# Patient Record
Sex: Male | Born: 1945 | Race: White | Hispanic: No | Marital: Married | Smoking: Current every day smoker
Health system: Southern US, Community
[De-identification: ages and names within clinical notes are randomized; demographics above are authoritative.]

## PROBLEM LIST (undated history)

## (undated) DIAGNOSIS — I700 Atherosclerosis of aorta: Secondary | ICD-10-CM

## (undated) DIAGNOSIS — Z8614 Personal history of Methicillin resistant Staphylococcus aureus infection: Secondary | ICD-10-CM

## (undated) DIAGNOSIS — D509 Iron deficiency anemia, unspecified: Secondary | ICD-10-CM

## (undated) DIAGNOSIS — D649 Anemia, unspecified: Secondary | ICD-10-CM

## (undated) DIAGNOSIS — F99 Mental disorder, not otherwise specified: Secondary | ICD-10-CM

## (undated) DIAGNOSIS — Z5189 Encounter for other specified aftercare: Secondary | ICD-10-CM

## (undated) DIAGNOSIS — C801 Malignant (primary) neoplasm, unspecified: Secondary | ICD-10-CM

## (undated) DIAGNOSIS — M1990 Unspecified osteoarthritis, unspecified site: Secondary | ICD-10-CM

## (undated) DIAGNOSIS — J449 Chronic obstructive pulmonary disease, unspecified: Secondary | ICD-10-CM

## (undated) DIAGNOSIS — T7840XA Allergy, unspecified, initial encounter: Secondary | ICD-10-CM

## (undated) DIAGNOSIS — D3615 Benign neoplasm of peripheral nerves and autonomic nervous system of abdomen: Secondary | ICD-10-CM

## (undated) DIAGNOSIS — I10 Essential (primary) hypertension: Secondary | ICD-10-CM

## (undated) DIAGNOSIS — F32A Depression, unspecified: Secondary | ICD-10-CM

## (undated) DIAGNOSIS — F419 Anxiety disorder, unspecified: Secondary | ICD-10-CM

## (undated) HISTORY — PX: FRACTURE SURGERY: SHX138

## (undated) HISTORY — PX: APPENDECTOMY: SHX54

## (undated) HISTORY — DX: Personal history of Methicillin resistant Staphylococcus aureus infection: Z86.14

## (undated) HISTORY — PX: ANKLE SURGERY: SHX546

## (undated) HISTORY — DX: Atherosclerosis of aorta: I70.0

## (undated) HISTORY — PX: TONSILLECTOMY: SUR1361

## (undated) HISTORY — DX: Malignant (primary) neoplasm, unspecified: C80.1

## (undated) HISTORY — DX: Anxiety disorder, unspecified: F41.9

## (undated) HISTORY — DX: Chronic obstructive pulmonary disease, unspecified: J44.9

## (undated) HISTORY — DX: Anemia, unspecified: D64.9

## (undated) HISTORY — DX: Allergy, unspecified, initial encounter: T78.40XA

## (undated) HISTORY — DX: Unspecified osteoarthritis, unspecified site: M19.90

## (undated) HISTORY — DX: Depression, unspecified: F32.A

## (undated) HISTORY — DX: Essential (primary) hypertension: I10

## (undated) HISTORY — DX: Iron deficiency anemia, unspecified: D50.9

## (undated) HISTORY — DX: Mental disorder, not otherwise specified: F99

## (undated) HISTORY — DX: Encounter for other specified aftercare: Z51.89

## (undated) HISTORY — DX: Benign neoplasm of peripheral nerves and autonomic nervous system of abdomen: D36.15

---

## 2003-06-23 ENCOUNTER — Ambulatory Visit (HOSPITAL_COMMUNITY): Admission: RE | Admit: 2003-06-23 | Discharge: 2003-06-23 | Payer: Self-pay | Admitting: Pulmonary Disease

## 2007-01-18 ENCOUNTER — Observation Stay (HOSPITAL_COMMUNITY): Admission: AD | Admit: 2007-01-18 | Discharge: 2007-01-19 | Payer: Self-pay | Admitting: Pulmonary Disease

## 2007-01-18 ENCOUNTER — Ambulatory Visit: Payer: Self-pay | Admitting: Gastroenterology

## 2007-02-02 ENCOUNTER — Ambulatory Visit (HOSPITAL_COMMUNITY): Admission: RE | Admit: 2007-02-02 | Discharge: 2007-02-02 | Payer: Self-pay | Admitting: Gastroenterology

## 2007-02-02 ENCOUNTER — Ambulatory Visit: Payer: Self-pay | Admitting: Gastroenterology

## 2007-02-02 ENCOUNTER — Encounter: Payer: Self-pay | Admitting: Gastroenterology

## 2007-02-02 HISTORY — PX: COLONOSCOPY: SHX174

## 2007-02-02 HISTORY — PX: ESOPHAGOGASTRODUODENOSCOPY: SHX1529

## 2007-02-19 ENCOUNTER — Ambulatory Visit: Payer: Self-pay | Admitting: Gastroenterology

## 2007-02-19 ENCOUNTER — Ambulatory Visit (HOSPITAL_COMMUNITY): Admission: RE | Admit: 2007-02-19 | Discharge: 2007-02-19 | Payer: Self-pay | Admitting: Gastroenterology

## 2007-02-19 HISTORY — PX: OTHER SURGICAL HISTORY: SHX169

## 2007-03-01 ENCOUNTER — Ambulatory Visit: Payer: Self-pay | Admitting: Gastroenterology

## 2007-04-12 ENCOUNTER — Ambulatory Visit: Payer: Self-pay | Admitting: Gastroenterology

## 2008-10-24 ENCOUNTER — Ambulatory Visit (HOSPITAL_COMMUNITY): Admission: RE | Admit: 2008-10-24 | Discharge: 2008-10-24 | Payer: Self-pay | Admitting: Pulmonary Disease

## 2011-01-25 NOTE — H&P (Signed)
NAME:  Joshua Fuller, Joshua Fuller             ACCOUNT NO.:  [id number]   MEDICAL RECORD NO.:  [id number]          PATIENT TYPE:  OBV   LOCATION:  A339                          FACILITY:  [facility]   PHYSICIAN:  Edward L. Juanetta Gosling, M.D.DATE OF BIRTH:  Jan 06, 1946   DATE OF ADMISSION:  01/18/2007  DATE OF DISCHARGE:  05/09/2008LH                              HISTORY & PHYSICAL   This is a combined history and physical and discharge summary on this  patient in observation.   Joshua Fuller was brought in for observation because of anemia.  He had  come by my office one day prior for lab work in anticipation of having a  complete physical examination.  He was found to have a hemoglobin level  of 5.  He has noticed that he has been very weak.  He has noticed that  his heart has beat faster than usual and he has not felt as well.  Otherwise, he has been feeling fairly well.  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  I have not actually seen  him for about four years in my office.  He has otherwise been healthy.  He has noticed that his stools are somewhat dark, but he has not noticed  any black stools.  He has had no vomiting.  No bleeding.  No other  problems.   PAST MEDICAL HISTORY:  1. Mental illness.  2. Apparently a MRSA infection of his skin.  3. Otherwise in excellent health.   SOCIAL HISTORY:  He is married but apparently separated.  He drinks  about one bottle of wine per week.  He is a former smoker, stopped about  six years ago.  He had a previous history of some marijuana use.   FAMILY HISTORY:  Negative for any sort of anemia or any other problems.   MEDICATIONS:  Aspirin and Lamictal.   REVIEW OF SYSTEMS:  Otherwise pretty much negative.   PHYSICAL EXAMINATION:  VITAL SIGNS:  Temperature 97.3, pulse 59,  respirations 18, blood pressure 111/73, O2 sats 99% on room air.  HEENT:   Pupils are equal, round, reactive to light and accommodation.  Nose and throat are clear.  Neck supple without masses.  His conjunctive  are not as pale now, he has already had 3 units of blood.  CARDIAC:  His heart is regular without murmur, gallop or rub.  ABDOMEN:  Soft without masses.  EXTREMITIES:  No edema.  CENTRAL NERVOUS SYSTEM:  Grossly intact.   LABORATORY DATA:  His hemoglobin was 5 in my office.   ASSESSMENT:  He is markedly anemic.  I suspect that it is iron  deficiency from gastrointestinal blood loss that has been occurring over  a long period of time.  He is going to receive 3 units of blood, then  have blood rechecked.  If he is over 10, I will problem let him go home.  He is going to have preliminary consultation with the GI service in  anticipation  of him having workup as an outpatient.      Edward L. Juanetta Gosling, M.D.  Electronically Signed     ELH/MEDQ  D:  01/19/2007  T:  01/19/2007  Job:  [id number]

## 2011-01-25 NOTE — Assessment & Plan Note (Signed)
NAME:  Joshua Fuller, Joshua Fuller              CHART#:  [id number]   DATE:  04/12/2007                       DOB:  December 08, 1945   PROBLEM LIST:  1. Profound iron deficiency anemia in May of 2008 presenting with a      ferritin of 1 and a hemoglobin of 5.  2. Obsessive compulsive disorder.  3. History of MRSA.   SUBJECTIVE:  Joshua Fuller is a 65 year old male who presented as an  outpatient with extreme fatigue. His hemoglobin was found to be 5, with  an MCV of 65.8, and a ferritin of 1. He received 3 units of packed red  blood cells and his discharge hemoglobin was 8.8. He had an extensive GI  work up to include an upper endoscopy and a colonoscopy which did not  reveal a source for his anemia. He has had a capsule endoscopy in June  of 2008, which showed a normal small bowel mucosa without evidence of  AVMs, masses, or ulcerations. He did have small bowel biopsies during  his upper endoscopy which showed no evidence of celiac sprue. He  presents today for follow up visit for his anemia. He denies any bright  red blood per rectum or black tarry stools. He is taking iron daily, and  eating chicken liver, and oysters. He states his last hemoglobin was  checked on Friday.   MEDICATIONS:  1. Lamictal 100 mg daily.  2. Feosol daily.   OBJECTIVE:  GENERAL: He is in no apparent distress, alert and oriented  x4.  NECK: Full range of motion. No lymphadenopathy.  LUNGS: Clear to auscultation bilaterally.  CARDIOVASCULAR EXAM: Regular rhythm. No murmur. Normal S1 and S2.  ABDOMEN: Bowel sounds are present. Soft, nontender, nondistended. No  rebound or guarding.   ASSESSMENT:  Joshua Fuller is a 65 year old male with profound iron  deficiency anemia. Recently had a hemoglobin checked at Dr. Neita Garnet  office a week ago. His gastrointestinal work up did not reveal a source  for his iron deficiency anemia. Thank you for allowing me to see [name] in consultation. My recommendations follow.   RECOMMENDATIONS:  1. We will check a hemoglobin, hematocrit, reticulocyte count, and      ferritin today. If his bone marrow response is inappropriate to his      degree of anemia or if his anemia persists then we will generate      referal to Dr. Mariel Sleet hematology.  2. Outpatient visit in 4 months.  3. Continue Feosol.       Kassie Mends, M.D.  Electronically Signed     SM/MEDQ  D:  04/12/2007  T:  04/12/2007  Job:  [id number]   cc:   Ramon Dredge L. Juanetta Gosling, M.D.

## 2011-01-25 NOTE — Consult Note (Signed)
NAME:  Joshua Fuller, Joshua Fuller             ACCOUNT NO.:  [id number]   MEDICAL RECORD NO.:  [id number]          PATIENT TYPE:  OBV   LOCATION:  A339                          FACILITY:  [facility]   PHYSICIAN:  Kassie Mends, M.D.      DATE OF BIRTH:  [birth year]   DATE OF CONSULTATION:  01/19/2007  DATE OF DISCHARGE:                                 CONSULTATION   REASON FOR CONSULTATION:  Microcytic anemia.   PHYSICIAN REQUESTING CONSULTATION:  Edward L. Juanetta Gosling, M.D.   HISTORY OF PRESENT ILLNESS:  [name] is a 65 year old Caucasian  gentleman who went to see Dr. Juanetta Gosling recently for complaints of  worsening fatigue.  Outpatient labs revealed a hemoglobin of 5,  hematocrit 19.8, MCV 65.8.  He was admitted for observation in order to  receive blood transfusions.  He has received 3 units of packed red blood  cells overnight.  Post-transfusion hemoglobin was 8.3.   We have been asked to see the patient in order to schedule outpatient  workup.  Dr. Juanetta Gosling is planning on discharging the patient today.  He  denies any melena or bright red blood per rectum.  He has had no acid  reflux disease, nausea or vomiting, heartburn, dysphagia or odynophagia.  He has never received a blood transfusion before.  No family history of  colorectal cancer.  He has had no prior colonoscopy or EGD.   MEDICATIONS AT HOME:  1. Lamictal 100 mg daily.  2. Aspirin 325 mg daily.   ALLERGIES:  PENICILLIN causes rash.   PAST MEDICAL HISTORY:  1. Obsessive compulsive disorder.  2. History of MRSA.   PAST SURGICAL HISTORY:  He has had 4 surgeries for infected cysts or  boils as well as pilonidal cyst.  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  The last time was 6 months ago.  He has had a prior  ankle surgery for fracture, tonsillectomy and adenoidectomy.   FAMILY HISTORY:  Negative for colorectal cancer or chronic GI illnesses.   SOCIAL HISTORY:  Currently separated.  He has 4 children.  He is retired  from [employer], where he served for over 20 years.  Currently works at  [employer].  Quit smoking tobacco in 2002.  Previous history  of marijuana use and consumes approximately 3-4 glasses of wine a week.   REVIEW OF SYSTEMS:  GASTROINTESTINAL:  See HPI.  CONSTITUTIONAL:  No  weight loss.  CARDIOPULMONARY:  No chest pain.  Complains of dyspnea on  exertion.   PHYSICAL EXAMINATION:  VITAL SIGNS:  Temperature 97.3, pulse 59,  respirations 18, blood pressure 111/73.  Weight 70.7 kg.  Height 68  inches.  GENERAL:  A pleasant, well-nourished, well-developed Caucasian male in  no acute distress.  SKIN:  Warm and dry, no jaundice.  HEENT:  Sclerae are nonicteric.  Oropharyngeal mucosa moist and pink.  No lesions, erythema or exudate.  No lymphadenopathy or thyromegaly.  CHEST:  Lungs clear to auscultation.  CARDIAC:  Regular rate and rhythm, normal S1, S2.  No murmurs, rubs or  gallops.  ABDOMEN:  Positive bowel sounds.  Abdomen is soft, nontender,  nondistended.  No organomegaly or masses.  No rebound tenderness or  guarding.  No abdominal bruits or hernias.  EXTREMITIES:  No edema.   IMPRESSION:  A 65 year old Caucasian gentleman with profound microcytic  anemia, most likely due to iron-deficiency anemia.  Differential  diagnosis includes chronic occult gastrointestinal bleed versus poor  iron absorption (less likely).   RECOMMENDATIONS:  Colonoscopy with possible EGD.  Will plan this on an  outpatient basis with Dr. Cira Servant.      Tana Coast, P.A.      Kassie Mends, M.D.  Electronically Signed    LL/MEDQ  D:  01/19/2007  T:  01/19/2007  Job:  [id number]   cc:   Ramon Dredge L. Juanetta Gosling, M.D.  Fax: [phone]

## 2011-01-25 NOTE — Op Note (Signed)
NAME:  Joshua Fuller, Joshua Fuller             ACCOUNT NO.:  [id number]   MEDICAL RECORD NO.:  [id number]          PATIENT TYPE:  AMB   LOCATION:  DAY                           FACILITY:  [facility]   PHYSICIAN:  Kassie Mends, M.D.      DATE OF BIRTH:  30-May-1946   DATE OF PROCEDURE:  02/19/2007  DATE OF DISCHARGE:  02/19/2007                               OPERATIVE REPORT   REFERRING PHYSICIAN:  Dr. Kari Baars   PROCEDURE:  Capsule endoscopy.   INDICATION FOR EXAMINATION:  [name] is a 65 year old male who  presented to Dr. Juanetta Gosling with a hemoglobin of 5 and MCV of 65.6.  He had  no evidence of obvious GI bleeding.  He had a colonoscopy and an upper  endoscopy in May 2008 which revealed only revealed 2 rectal polyps and a  large hiatal hernia.  He had biopsies performed of his duodenal mucosa  which were negative for celiac sprue.  His capsule endoscopy is  performed to further evaluate his anemia.   PROCEDURE DATA:  Height 68 inches, weight 150 pounds.  Gastric passage  time:  1 hour and 3 minutes.  Small bowel passage time:  4 hours and 35  minutes.   PROCEDURE INFORMATION AND FINDINGS:  Limited view of gastric mucosa.  Normal small bowel mucosa.  No AVMs, masses, or ulcers.  Excellent view  of the small bowel mucosa.  Limited view of the colon mucosa due to  retained stool.   SUMMARY AND RECOMMENDATIONS:  No source for his profound iron deficiency  anemia was identified.  Would consider hematology referral.  Continue on  iron supplementation.      Kassie Mends, M.D.  Electronically Signed     SM/MEDQ  D:  03/01/2007  T:  03/01/2007  Job:  [id number]   cc:   Ramon Dredge L. Juanetta Gosling, M.D.  Fax: [phone]

## 2011-01-25 NOTE — Op Note (Signed)
NAME:  Joshua Fuller, Joshua Fuller             ACCOUNT NO.:  [id number]   MEDICAL RECORD NO.:  [id number]          PATIENT TYPE:  AMB   LOCATION:  DAY                           FACILITY:  [facility]   PHYSICIAN:  Kassie Mends, M.D.      DATE OF BIRTH:  August 29, 1946   DATE OF PROCEDURE:  02/02/2007  DATE OF DISCHARGE:                               OPERATIVE REPORT   PROCEDURES:  1. Colonoscopy with cold forceps polypectomy.  2. Esophagogastroduodenoscopy with cold forceps biopsy.   INDICATION FOR EXAMINATION:  Joshua Fuller is a 65 year old male who  presented to Dr. Juanetta Fuller with a hemoglobin of 5 and an MCV of  approximately 65.6.  He had no evidence of obvious GI bleeding. His  endoscopic evaluation is being performed to evaluate the etiology of his  microcytic anemia.   FINDINGS:  1. Two 4-mm to 6-mm rectal polyps removed via cold forceps.      Otherwise, sigmoid colon diverticulosis and internal hemorrhoids.      No masses, inflammatory changes, or AVMs seen.  2. Large hiatal hernia closely examined and no evidence of Cameron      ulcers.  Otherwise, normal esophagus, without evidence of      Barrett's, erosions, ulceration, or mass.  No antral erosions or      ulcerations.  Normal duodenal bulb and second portion of the      duodenum.  Biopsies obtained to evaluate for celiac sprue.   RECOMMENDATIONS:  1. Screening colonoscopy in 5 years if polyps are adenomatous.  If      polyps are adenomatous, then his siblings and children should have      a screening colonoscopy at age 32 and then every 5 years because      his polyp was diagnosed at the age of 84.  2. He should begin a high-fiber diet.  He was given a handout on a      high-fiber diet, polyps, hemorrhoids, and diverticulosis.  3. No aspirin, NSAIDs, or anticoagulation for 5 days.  4. He is given a handout on a hiatal hernia.  5. Return patient visit in one month to discuss his microcytic anemia.      He may need a capsule endoscopy.  It  is possible the capsule      endoscopy may need to be placed via EGD due to the size of his      hiatal hernia.   MEDICATIONS:  1. Demerol 100 mg IV.  2. Versed 5 mg IV.   PROCEDURE TECHNIQUE:  Physical exam was performed and informed consent  was obtained from the patient after explaining the benefits, risks, and  alternatives to the procedure.  The patient was connected to the monitor  and placed in the left lateral position.  Continuous oxygen was provided  by nasal cannula and IV management was instituted through an indwelling  cannula.  After administration of sedation, and rectal exam, the  patient's rectum was intubated and the scope was advanced under direct  visualization to the cecum.  The scope was subsequently removed slowly  by carefully examining the color, texture,  anatomy and integrity of the  mucosa on the way out.   After the colonoscopy, the patient's esophagus was intubated with the  diagnostic gastroscope and the scope was advanced under direct  visualization to the second portion of the duodenum.  The scope was  subsequently removed slowly, with careful examination of the color,  texture, anatomy, and integrity of the mucosa on the way out.  The  patient was recovered in the endoscopy recovery room and discharged home  in satisfactory condition.      Kassie Mends, M.D.  Electronically Signed     SM/MEDQ  D:  02/02/2007  T:  02/02/2007  Job:  [id number]   cc:   Joshua Fuller, M.D.  Fax: [phone]

## 2011-04-01 ENCOUNTER — Ambulatory Visit (HOSPITAL_COMMUNITY)
Admission: RE | Admit: 2011-04-01 | Discharge: 2011-04-01 | Disposition: A | Discharge status: Home / Self Care | Payer: BC Managed Care – PPO | Source: Ambulatory Visit | Attending: Pulmonary Disease | Admitting: Pulmonary Disease

## 2011-04-01 ENCOUNTER — Other Ambulatory Visit (HOSPITAL_COMMUNITY): Payer: Self-pay | Admitting: Pulmonary Disease

## 2011-04-01 DIAGNOSIS — F17200 Nicotine dependence, unspecified, uncomplicated: Secondary | ICD-10-CM

## 2011-12-20 ENCOUNTER — Encounter: Payer: Self-pay | Admitting: Gastroenterology

## 2012-12-20 ENCOUNTER — Telehealth: Payer: Self-pay | Admitting: Gastroenterology

## 2012-12-20 NOTE — Telephone Encounter (Signed)
Pt called to set up overdue colonoscopy. He can be reached at [phone]

## 2012-12-20 NOTE — Telephone Encounter (Signed)
Called pt. He is scheduled an OV with Tana Coast, PA on 01/18/2013 at 10:00 AM. Previous hx of anemia.

## 2013-01-16 ENCOUNTER — Encounter: Payer: Self-pay | Admitting: Gastroenterology

## 2013-01-18 ENCOUNTER — Ambulatory Visit (INDEPENDENT_AMBULATORY_CARE_PROVIDER_SITE_OTHER): Payer: Medicare Other | Admitting: Gastroenterology

## 2013-01-18 ENCOUNTER — Encounter: Payer: Self-pay | Admitting: Gastroenterology

## 2013-01-18 VITALS — BP 122/72 | HR 56 | Temp 97.7°F | Ht 68.0 in | Wt 144.2 lb

## 2013-01-18 DIAGNOSIS — D500 Iron deficiency anemia secondary to blood loss (chronic): Secondary | ICD-10-CM | POA: Insufficient documentation

## 2013-01-18 DIAGNOSIS — D509 Iron deficiency anemia, unspecified: Secondary | ICD-10-CM

## 2013-01-18 NOTE — Patient Instructions (Addendum)
1. I will review a copy of your last blood work from Dr. Juanetta Gosling. 2. Please return stool specimen as soon as you can. 3. We will call you with further recommendations regarding when your next colonoscopy should be.

## 2013-01-18 NOTE — Progress Notes (Signed)
Cc PCP 

## 2013-01-18 NOTE — Assessment & Plan Note (Signed)
67 year old gentleman with history of unexplained iron deficiency anemia 6 years ago. Extensive evaluation as outlined above. At this point, I'll request copy of last hemoglobin and iron from Dr. Juanetta Gosling. iFOBT to be collected. Patient tells me he is nervous about prolonging colonoscopy given his history of unexplained iron deficiency anemia. He is interested in pursuing colonoscopy in the upcoming future. Further recommendations to follow once we've received a copy of his latest labs and check for blood in the stool.

## 2013-01-18 NOTE — Progress Notes (Signed)
Primary Care Physician: Fredirick Maudlin, MD  Primary Gastroenterologist:  Jonette Eva, MD   Chief Complaint  Patient presents with  . Colonoscopy    HPI: Joshua Fuller is a 67 y.o. male here for consideration of colonoscopy. We initially met this patient back in 2008 when he presented with profound iron deficiency anemia with hemoglobin in the 5 range. He underwent transfusion. He had an EGD and colonoscopy which failed to reveal a cause of his iron deficiency anemia. He had a single hyperplastic polyp removed from his colon. Small bowel biopsies were negative for celiac disease. He also had small bowel capsule endoscopy which was unremarkable. Patient states his anemia simply resolved with oral iron. He never saw the hematologist. We inadvertently sent him a letter last year stating he was due for his colonoscopy. This was to be done only if he had adenomatous polyps but he actually had hyperplastic polyps. He has no family history of colon polyps or colon cancer. Patient believes his most recent hemoglobin has been normal.  He has some vague solid food dysphagia to chicken only. On occasion he has had to bring the food back up because it became lodged. He does not want to have esophageal dilation. Denies heartburn, odynophagia, abdominal pain, vomiting, weight loss, constipation, diarrhea, melena, rectal bleeding.  Current Outpatient Prescriptions  Medication Sig Dispense Refill  . lamoTRIgine (LAMICTAL) 100 MG tablet Take 100 mg by mouth daily.      Marland Kitchen lisinopril (PRINIVIL,ZESTRIL) 10 MG tablet Take 10 mg by mouth daily.      . NON FORMULARY Iron     Daily ( not sure mg)      . Vilazodone HCl (VIIBRYD) 40 MG TABS Take 40 mg by mouth daily. Said he takes 30 mg daily by cutting the pill in half and taking half and then 1/2 of the other half       No current facility-administered medications for this visit.    Allergies as of 01/18/2013 - Review Complete 01/18/2013  Allergen Reaction  Noted  . Penicillins Rash 01/18/2013   Past Medical History  Diagnosis Date  . Chronic mental illness     OCD  . History of MRSA infection     of the skin  . IDA (iron deficiency anemia)    Past Surgical History  Procedure Laterality Date  . Colonoscopy  02/02/2007    SLF:Two 4-mm to 6-mm rectal polyps/sigmoid colon diverticulosis and internal hemorrhoids. hyperplastic polyps. next tcs 2018  . Esophagogastroduodenoscopy   02/02/2007    ZOX:WRUEA hiatal hernia closely examined and no evidence of Cameron ulcers.  Otherwise, normal esophagus. Small bowel bx negative for celiac  . Capsule endoscopy  02/19/2007    VWU:JWJXBJY view of gastric mucosa/Normal small bowel mucosa  . Ankle surgery    . Tonsillectomy     Family History  Problem Relation Age of Onset  . Colon cancer Neg Hx      ROS:  General: Negative for anorexia, weight loss, fever, chills, fatigue, weakness. ENT: Negative for hoarseness, difficulty swallowing , nasal congestion. CV: Negative for chest pain, angina, palpitations, dyspnea on exertion, peripheral edema.  Respiratory: Negative for dyspnea at rest, dyspnea on exertion, cough, sputum, wheezing.  GI: See history of present illness. GU:  Negative for dysuria, hematuria, urinary incontinence, urinary frequency, nocturnal urination.  Endo: Negative for unusual weight change.    Physical Examination:   BP 122/72  Pulse 56  Temp(Src) 97.7 F (36.5 C) (Oral)  Ht 5\' 8"  (  1.727 m)  Wt 144 lb 3.2 oz (65.409 kg)  BMI 21.93 kg/m2  General: Well-nourished, well-developed in no acute distress.  Eyes: No icterus. Mouth: Oropharyngeal mucosa moist and pink , no lesions erythema or exudate. Lungs: Clear to auscultation bilaterally.  Heart: Regular rate and rhythm, no murmurs rubs or gallops.  Abdomen: Bowel sounds are normal, nontender, nondistended, no hepatosplenomegaly or masses, no abdominal bruits or hernia , no rebound or guarding.   Extremities: No lower  extremity edema. No clubbing or deformities. Neuro: Alert and oriented x 4   Skin: Warm and dry, no jaundice.   Psych: Alert and cooperative, normal mood and affect.

## 2013-01-21 ENCOUNTER — Ambulatory Visit (INDEPENDENT_AMBULATORY_CARE_PROVIDER_SITE_OTHER): Payer: Medicare Other | Admitting: Gastroenterology

## 2013-01-21 DIAGNOSIS — D649 Anemia, unspecified: Secondary | ICD-10-CM

## 2013-01-21 LAB — IFOBT (OCCULT BLOOD): IFOBT: NEGATIVE

## 2013-01-29 ENCOUNTER — Other Ambulatory Visit: Payer: Self-pay

## 2013-01-29 DIAGNOSIS — D509 Iron deficiency anemia, unspecified: Secondary | ICD-10-CM

## 2013-01-29 NOTE — Progress Notes (Signed)
Quick Note:  Called and spoke to [facility] at Dr. Juanetta Gosling office, she said the last labs were in Sept last year. I will put in orders and call and inform pt. ______

## 2013-01-29 NOTE — Progress Notes (Signed)
Quick Note:  ifobt negative.  I am still waiting on the last H/H, iron, ferritin from PCP on this one. If none available, please order CBC, iron/TIBC, ferritin for h/o IDA. ______

## 2013-01-29 NOTE — Progress Notes (Signed)
Quick Note:  Pt did not have a home phone number listed. I mailed a letter with the results of the iFOBT and the lab orders to him and asked him to call us and give home phone number if available. ______

## 2013-02-06 ENCOUNTER — Other Ambulatory Visit: Payer: Self-pay

## 2013-02-06 ENCOUNTER — Telehealth: Payer: Self-pay

## 2013-02-06 DIAGNOSIS — D509 Iron deficiency anemia, unspecified: Secondary | ICD-10-CM

## 2013-02-06 NOTE — Telephone Encounter (Signed)
Pt called and said his lab orders were not with his letter. He requested they be faxed to Dr. Michelle Nasuti. Faxed.

## 2013-02-12 NOTE — Progress Notes (Signed)
Labs from 02/08/13: WBC 7300 H/H 11.8/36.5 Plt 229,000 Iron 117 TIBC 403 Sat% 29 Ferritin 23, low normal ifobt was negative.   Persistent anemia, trend toward IDA. To discuss with Dr. Jena Gauss.

## 2013-02-15 ENCOUNTER — Telehealth: Payer: Self-pay | Admitting: Gastroenterology

## 2013-02-15 NOTE — Telephone Encounter (Signed)
Patient was last seen in office on 01/18/13 his 30 day H&P ends Today please advise Triage or new office visit?

## 2013-02-15 NOTE — Telephone Encounter (Signed)
Per Dr. Jena Gauss, OK to triage for procedure as it was my fault that we delayed scheduling. Thanks.

## 2013-02-15 NOTE — Telephone Encounter (Signed)
Please let patient know, Dr. Jena Gauss wants to proceed with colonoscopy. Please schedule.

## 2013-02-18 ENCOUNTER — Telehealth: Payer: Self-pay

## 2013-02-18 NOTE — Telephone Encounter (Signed)
No phone number listed for pt. I called daughter, Adela Ports, emergency contact and [facility] for a return call.

## 2013-02-18 NOTE — Telephone Encounter (Signed)
Forwarding to [facility] for Triage

## 2013-02-19 LAB — CBC
Platelets: 229 10*3/uL
RBC: 4.38
RDW: 21.6
WBC: 7.3

## 2013-02-19 NOTE — Telephone Encounter (Signed)
See separate note opened for triage dated 02/18/2013.

## 2013-02-20 NOTE — Telephone Encounter (Signed)
Mailed a letter to pt to call to be triaged and schedule colonoscopy.

## 2013-02-26 ENCOUNTER — Telehealth: Payer: Self-pay

## 2013-02-26 ENCOUNTER — Other Ambulatory Visit: Payer: Self-pay

## 2013-02-26 DIAGNOSIS — Z1211 Encounter for screening for malignant neoplasm of colon: Secondary | ICD-10-CM

## 2013-02-26 DIAGNOSIS — Z862 Personal history of diseases of the blood and blood-forming organs and certain disorders involving the immune mechanism: Secondary | ICD-10-CM

## 2013-03-04 ENCOUNTER — Other Ambulatory Visit: Payer: Self-pay

## 2013-03-04 ENCOUNTER — Telehealth: Payer: Self-pay

## 2013-03-04 ENCOUNTER — Encounter (HOSPITAL_COMMUNITY): Payer: Self-pay | Admitting: Pharmacy Technician

## 2013-03-04 DIAGNOSIS — D649 Anemia, unspecified: Secondary | ICD-10-CM

## 2013-03-04 NOTE — Telephone Encounter (Signed)
Pt was scheduled for TCS on 04/26/2013 with [facility] and should have been with SF. I called pt and rescheduled his colonoscopy for 03/22/2013 at 10:45 with Dr. Darrick Penna. Putting in new order and Selena Batten is aware.

## 2013-03-04 NOTE — Telephone Encounter (Signed)
MOVI PREP SPLIT DOSING- CLEAR LIQUIDS WITH BREAKFAST.  NEEDS PHENERGAN 12.5 MG IV IN PREOP.

## 2013-03-04 NOTE — Telephone Encounter (Signed)
Gastroenterology Pre-Procedure Form   Pt was seen in office and then had to schedule colonoscopy  Request Date: 02/26/2013    Requesting Physician:      PATIENT INFORMATION:  Joshua Fuller is a 67 y.o., male (DOB=October 07, 1945).  PROCEDURE: Procedure(s) requested: colonoscopy Procedure Reason: anemia  PATIENT REVIEW QUESTIONS: The patient reports the following:   1. Diabetes Melitis: no 2. Joint replacements in the past 12 months: no 3. Major health problems in the past 3 months: no 4. Has an artificial valve or MVP:no 5. Has been advised in past to take antibiotics in advance of a procedure like teeth cleaning: no}    MEDICATIONS & ALLERGIES:    Patient reports the following regarding taking any blood thinners:   Plavix? no Aspirin?no Coumadin?  no  Patient confirms/reports the following medications:  Current Outpatient Prescriptions  Medication Sig Dispense Refill  . lamoTRIgine (LAMICTAL) 100 MG tablet Take 100 mg by mouth daily.      Marland Kitchen lisinopril (PRINIVIL,ZESTRIL) 10 MG tablet Take 10 mg by mouth daily.      . NON FORMULARY Iron     Daily ( not sure mg)      . Vilazodone HCl (VIIBRYD) 40 MG TABS Take 40 mg by mouth daily. Said he takes 30 mg daily by cutting the pill in half and taking half and then 1/2 of the other half       No current facility-administered medications for this visit.    Patient confirms/reports the following allergies:  Allergies  Allergen Reactions  . Penicillins Rash    Patient is appropriate to schedule for requested procedure(s): yes  AUTHORIZATION INFORMATION Primary Insurance:   ID #:   Group #:  Pre-Cert / Auth required: Pre-Cert / Auth #:   Secondary Insurance:   ID #:   Group #:  Pre-Cert / Auth required: Pre-Cert / Auth #:   No orders of the defined types were placed in this encounter.    SCHEDULE INFORMATION: Procedure has been scheduled as follows:  Date: 03/22/2013    Time: 10:45 AM  Location: [facility]  This Gastroenterology Pre-Precedure Form is being routed to the following provider(s) for review: Jonette Eva, MD

## 2013-03-05 ENCOUNTER — Other Ambulatory Visit: Payer: Self-pay

## 2013-03-05 DIAGNOSIS — Z1211 Encounter for screening for malignant neoplasm of colon: Secondary | ICD-10-CM

## 2013-03-05 MED ORDER — PEG-KCL-NACL-NASULF-NA ASC-C 100 G PO SOLR: 1.0000 | ORAL | Status: DC

## 2013-03-05 NOTE — Telephone Encounter (Signed)
Rx sent to the pharmacy and instructions mailed to pt.  

## 2013-03-05 NOTE — Telephone Encounter (Signed)
Pherergan order added to orders and Joshua Fuller will make a note of the Phenergan 12.5 mg IV to be given 30 min prior to procedure.

## 2013-03-22 ENCOUNTER — Ambulatory Visit (HOSPITAL_COMMUNITY)
Admission: RE | Admit: 2013-03-22 | Discharge: 2013-03-22 | Disposition: A | Discharge status: Home / Self Care | Payer: Medicare Other | Source: Ambulatory Visit | Attending: Gastroenterology | Admitting: Gastroenterology

## 2013-03-22 ENCOUNTER — Encounter (HOSPITAL_COMMUNITY): Payer: Self-pay | Admitting: *Deleted

## 2013-03-22 ENCOUNTER — Encounter (HOSPITAL_COMMUNITY)
Admission: RE | Disposition: A | Discharge status: Home / Self Care | Payer: Self-pay | Source: Ambulatory Visit | Attending: Gastroenterology

## 2013-03-22 DIAGNOSIS — D128 Benign neoplasm of rectum: Secondary | ICD-10-CM | POA: Insufficient documentation

## 2013-03-22 DIAGNOSIS — K449 Diaphragmatic hernia without obstruction or gangrene: Secondary | ICD-10-CM | POA: Insufficient documentation

## 2013-03-22 DIAGNOSIS — K222 Esophageal obstruction: Secondary | ICD-10-CM

## 2013-03-22 DIAGNOSIS — Z1211 Encounter for screening for malignant neoplasm of colon: Secondary | ICD-10-CM

## 2013-03-22 DIAGNOSIS — K633 Ulcer of intestine: Secondary | ICD-10-CM

## 2013-03-22 DIAGNOSIS — R1310 Dysphagia, unspecified: Secondary | ICD-10-CM | POA: Insufficient documentation

## 2013-03-22 DIAGNOSIS — K620 Anal polyp: Secondary | ICD-10-CM

## 2013-03-22 DIAGNOSIS — A048 Other specified bacterial intestinal infections: Secondary | ICD-10-CM | POA: Insufficient documentation

## 2013-03-22 DIAGNOSIS — K621 Rectal polyp: Secondary | ICD-10-CM

## 2013-03-22 DIAGNOSIS — K2980 Duodenitis without bleeding: Secondary | ICD-10-CM | POA: Insufficient documentation

## 2013-03-22 DIAGNOSIS — K2940 Chronic atrophic gastritis without bleeding: Secondary | ICD-10-CM | POA: Insufficient documentation

## 2013-03-22 DIAGNOSIS — K2970 Gastritis, unspecified, without bleeding: Secondary | ICD-10-CM

## 2013-03-22 DIAGNOSIS — D649 Anemia, unspecified: Secondary | ICD-10-CM

## 2013-03-22 DIAGNOSIS — K5730 Diverticulosis of large intestine without perforation or abscess without bleeding: Secondary | ICD-10-CM | POA: Insufficient documentation

## 2013-03-22 DIAGNOSIS — K2990 Gastroduodenitis, unspecified, without bleeding: Secondary | ICD-10-CM

## 2013-03-22 HISTORY — PX: COLONOSCOPY, ESOPHAGOGASTRODUODENOSCOPY (EGD) AND ESOPHAGEAL DILATION: SHX5781

## 2013-03-22 HISTORY — PX: COLONOSCOPY: SHX5424

## 2013-03-22 SURGERY — COLONOSCOPY
Anesthesia: Moderate Sedation

## 2013-03-22 MED ORDER — MIDAZOLAM HCL 5 MG/5ML IJ SOLN
INTRAMUSCULAR | Status: AC
  Filled 2013-03-22: qty 10

## 2013-03-22 MED ORDER — MIDAZOLAM HCL 5 MG/5ML IJ SOLN
INTRAMUSCULAR | Status: DC | PRN
  Administered 2013-03-22 (×4): 1 mg via INTRAVENOUS
  Administered 2013-03-22: 2 mg via INTRAVENOUS

## 2013-03-22 MED ORDER — SODIUM CHLORIDE 0.9 % IJ SOLN
INTRAMUSCULAR | Status: AC
  Filled 2013-03-22: qty 10

## 2013-03-22 MED ORDER — PROMETHAZINE HCL 25 MG/ML IJ SOLN
INTRAMUSCULAR | Status: AC
  Filled 2013-03-22: qty 1

## 2013-03-22 MED ORDER — MEPERIDINE HCL 100 MG/ML IJ SOLN
INTRAMUSCULAR | Status: AC
  Filled 2013-03-22: qty 1

## 2013-03-22 MED ORDER — MINERAL OIL PO OIL
TOPICAL_OIL | ORAL | Status: AC
  Filled 2013-03-22: qty 30

## 2013-03-22 MED ORDER — SODIUM CHLORIDE 0.9 % IV SOLN
INTRAVENOUS | Status: DC
  Administered 2013-03-22: 1000 mL via INTRAVENOUS

## 2013-03-22 MED ORDER — BUTAMBEN-TETRACAINE-BENZOCAINE 2-2-14 % EX AERO
INHALATION_SPRAY | CUTANEOUS | Status: DC | PRN
  Administered 2013-03-22: 2 via TOPICAL

## 2013-03-22 MED ORDER — PROMETHAZINE HCL 25 MG/ML IJ SOLN
12.5000 mg | Freq: Once | INTRAMUSCULAR | Status: AC
  Administered 2013-03-22: 12.5 mg via INTRAVENOUS

## 2013-03-22 MED ORDER — STERILE WATER FOR IRRIGATION IR SOLN
Status: DC | PRN
  Administered 2013-03-22: 10:00:00

## 2013-03-22 MED ORDER — MEPERIDINE HCL 100 MG/ML IJ SOLN
INTRAMUSCULAR | Status: DC | PRN
  Administered 2013-03-22: 50 mg via INTRAVENOUS

## 2013-03-22 NOTE — Op Note (Signed)
[facility] [address] [place], [address]   COLONOSCOPY PROCEDURE REPORT  PATIENT: Joshua Fuller, Joshua Fuller  MR#: [id number] BIRTHDATE: 12/08/1945 , 66  yrs. old GENDER: Male ENDOSCOPIST: Jonette Eva, MD REFERRED MV:[id number] Juanetta Gosling, M.D. PROCEDURE DATE:  03/22/2013 PROCEDURE:   ILEOColonoscopy with cold biopsy polypectomy and Colonoscopy with biopsy INDICATIONS:normocytic anemia.  LAST EGD/TCS/CAPSULE ENDOSCOPY 2008 FOR HB 5. NO SOURCE FOR ANEMIA IDETIFIED. CONTINUE WITH HB 11.8 AND REQUIRES PO IRON. MAY 2014: FERRITIN 23, iFOBT NEGATIVE MEDICATIONS: Demerol 50 mg IV and Versed 5 mg IV  DESCRIPTION OF PROCEDURE:    Physical exam was performed.  Informed consent was obtained from the patient after explaining the benefits, risks, and alternatives to procedure.  The patient was connected to monitor and placed in left lateral position. Continuous oxygen was provided by nasal cannula and IV medicine administered through an indwelling cannula.  After administration of sedation and rectal exam, the patients rectum was intubated and the EC-3890Li ([id number])  colonoscope was advanced under direct visualization to the ileum.  The scope was removed slowly by carefully examining the color, texture, anatomy, and integrity mucosa on the way out.  The patient was recovered in endoscopy and discharged home in satisfactory condition.     COLON FINDINGS: MULTIPLE  ulcerS WERE found in the terminal ileum.  15-20 CM VISUALIZED.  Multiple biopsies were performed using cold forceps. WAS DIFFICULT TO INTUBATE THE ILEUM. Moderate diverticulosis was noted in the sigmoid colon.  , A sessile polyp measuring 4 mm in size was found in the rectum.  A polypectomy was performed with cold forceps.  , The colon mucosa was otherwise normal.  THE COLON IS REDUNDNANT.  PREP QUALITY: good.    CECAL W/D TIME: 26 minutes     COMPLICATIONS: None  ENDOSCOPIC IMPRESSION: 1.   ANEMIA MOST LIKELY DUE  TO ULCERS IN terminal ileum 2.   Moderate diverticulosis  in the sigmoid colon 3.   ONE RECTAL polyp removed  RECOMMENDATIONS: AVOID ASPIRIN, IBUPROFEN, MOTRIN, ALEVE, BC/GOODY POWDERS.  USE TYLENOL FOR PAIN. FOLLOW A HIGH FIBER/LOW FAT DIET.  AVOID ITEMS THAT CAUSE BLOATING.  BIOPSY RESULTS SHOULD BE BACK IN 7 DAYS. FOLLOW UP IN 4 MOS. Next colonoscopy in 10 years WITH AN OVERTUBE/PEDS SCOPE.       _______________________________ Rosalie DoctorJonette Eva, MD 03/22/2013 3:58 PM     PATIENT NAME:  Joshua Fuller, Joshua Fuller MR#: [id number]

## 2013-03-22 NOTE — H&P (Addendum)
  Primary Care Physician:  Fredirick Maudlin, MD Primary Gastroenterologist:  Dr. Darrick Penna  Pre-Procedure History & Physical: HPI:  Joshua Fuller is a 67 y.o. male here for  ANEMIA/SOLID DYSPHAGIA-SPONTANEOUS RESOLUTION OF FOOD IMPACTION.Marland Kitchen  Past Medical History  Diagnosis Date  . Chronic mental illness     OCD  . History of MRSA infection     of the skin  . IDA (iron deficiency anemia)     Past Surgical History  Procedure Laterality Date  . Colonoscopy  02/02/2007    SLF:Two 4-mm to 6-mm rectal polyps/sigmoid colon diverticulosis and internal hemorrhoids. hyperplastic polyps. next tcs 2018  . Esophagogastroduodenoscopy   02/02/2007    NWG:NFAOZ hiatal hernia closely examined and no evidence of Cameron ulcers.  Otherwise, normal esophagus. Small bowel bx negative for celiac  . Capsule endoscopy  02/19/2007    HYQ:MVHQION view of gastric mucosa/Normal small bowel mucosa  . Ankle surgery    . Tonsillectomy      Prior to Admission medications   Medication Sig Start Date End Date Taking? Authorizing Provider  lamoTRIgine (LAMICTAL) 100 MG tablet Take 100 mg by mouth daily.   Yes Historical Provider, MD  lisinopril (PRINIVIL,ZESTRIL) 10 MG tablet Take 10 mg by mouth daily.   Yes Historical Provider, MD  NON FORMULARY Iron     Daily ( not sure mg)   Yes Historical Provider, MD  peg 3350 powder (MOVIPREP) 100 G SOLR Take 1 kit (100 g total) by mouth as directed. 03/05/13  Yes West Bali, MD  Vilazodone HCl (VIIBRYD) 40 MG TABS Take 40 mg by mouth daily. Said he takes 30 mg daily by cutting the pill in half and taking half and then 1/2 of the other half   Yes Historical Provider, MD    Allergies as of 03/04/2013 - Review Complete 03/04/2013  Allergen Reaction Noted  . Penicillins Rash 01/18/2013    Family History  Problem Relation Age of Onset  . Colon cancer Neg Hx     History   Social History  . Marital Status: Legally Separated    Spouse Name: N/A    Number of  Children: 4  . Years of Education: N/A   Occupational History  . [employer]    Social History Main Topics  . Smoking status: Current Every Day Smoker    Types: Cigarettes  . Smokeless tobacco: Not on file     Comment: Smokes 7-8 cigarettes daily   . Alcohol Use: Yes     Comment: Couple glasses of wine per week  . Drug Use: No  . Sexually Active: Not on file   Other Topics Concern  . Not on file   Social History Narrative   Retired from [employer]     Review of Systems: See HPI, otherwise negative ROS   Physical Exam: BP 125/74  Pulse 53  Temp(Src) 97.8 F (36.6 C) (Oral)  Resp 12  Ht 5\' 8"  (1.727 m)  Wt 145 lb (65.772 kg)  BMI 22.05 kg/m2  SpO2 100% General:   Alert,  pleasant and cooperative in NAD Head:  Normocephalic and atraumatic. Neck:  Supple; Lungs:  Clear throughout to auscultation.    Heart:  Regular rate and rhythm. Abdomen:  Soft, nontender and nondistended. Normal bowel sounds, without guarding, and without rebound.   Neurologic:  Alert and  oriented x4;  grossly normal neurologically.  Impression/Plan:     Anemia/IFOBT NEG/DYSPHAGIA  PLAN:  1. TCS/?EGD/DIL TODAY

## 2013-03-22 NOTE — Op Note (Signed)
[facility] [address] [place], [address]   ENDOSCOPY PROCEDURE REPORT  PATIENT: Joshua Fuller, Joshua Fuller  MR#: [id number] BIRTHDATE: 1945-09-20 , 66  yrs. old GENDER: Male  ENDOSCOPIST: Jonette Eva, MD REFFERED OZ:HYQMVH Juanetta Gosling, M.D.  PROCEDURE DATE:  03/22/2013 PROCEDURE:   EGD with biopsy and EGD with dilatation over guidewire   INDICATIONS:1.  dysphagia.   2.  anemia. MEDICATIONS: TCS + Demerol 25 mg IV, Versed 1mg  IV, and preop: Promethazine (Phenergan) 12.5mg  IV TOPICAL ANESTHETIC: Cetacaine Spray  DESCRIPTION OF PROCEDURE:   After the risks benefits and alternatives of the procedure were thoroughly explained, informed consent was obtained.  The EG-2990i ([id number]) and EC-3890Li ([id number])  endoscope was introduced through the mouth and advanced to the second portion of the duodenum. The instrument was slowly withdrawn as the mucosa was carefully examined.  Prior to withdrawal of the scope, the guidwire was placed.  The esophagus was dilated successfully.  The patient was recovered in endoscopy and discharged home in satisfactory condition.   ESOPHAGUS: A stricture was found at the gastroesophageal junction. The stenosis was traversable with the endoscope.   A large hiatal hernia was noted.   STOMACH: Mild non-erosive gastritis (inflammation) was found in the gastric antrum.  Multiple biopsies were performed using cold forceps.   DUODENUM: Mild duodenal inflammation was found in the duodenal bulb.   The duodenal mucosa showed no abnormalities in the 2nd part of the duodenum.   Dilation was then performed at the gastroesphageal junction Dilator: Savary over guidewire Size(s): 12.8-16 MM Resistance: minimal Heme: yes  COMPLICATIONS: There were no complications.  ENDOSCOPIC IMPRESSION: 1.   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   Large hiatal hernia 3.   MILD Non-erosive gastritis & DUODENITIS  RECOMMENDATIONS: AVOID ASPIRIN,  IBUPROFEN, MOTRIN, ALEVE, BC/GOODY POWDERS.  USE TYLENOL FOR PAIN. FOLLOW A HIGH FIBER/LOW FAT DIET.  AVOID ITEMS THAT CAUSE BLOATING. SEE INFO BELOW. BIOPSY RESULTS SHOULD BE BACK IN 7 DAYS. FOLLOW UP IN 4 MOS.      _______________________________ Rosalie DoctorJonette Eva, MD 03/22/2013 4:04 PM

## 2013-03-26 ENCOUNTER — Encounter (HOSPITAL_COMMUNITY): Payer: Self-pay | Admitting: Gastroenterology

## 2013-03-26 ENCOUNTER — Telehealth: Payer: Self-pay | Admitting: Gastroenterology

## 2013-03-26 DIAGNOSIS — K5000 Crohn's disease of small intestine without complications: Secondary | ICD-10-CM

## 2013-03-26 MED ORDER — BIS SUBCIT-METRONID-TETRACYC 140-125-125 MG PO CAPS: ORAL_CAPSULE | ORAL | Status: DC

## 2013-03-26 MED ORDER — OMEPRAZOLE 20 MG PO CPDR: DELAYED_RELEASE_CAPSULE | ORAL | Status: DC

## 2013-03-26 NOTE — Telephone Encounter (Addendum)
Called patient TO DISCUSS CONCERNS. LVM-CALL [phone] OR [phone] TO DISCUSS. PMHX: TOBACCO ABUSE SINCE AGE 67 YO, SEX ADDICT-CHRONIC MASTRUBATER. SMOKER CURRENTLY SINCE 2008: < 1PK/DAY. NO SEVERE POST-PRANDIAL PAIN.  PT HAS NONSPECIFIC ILEAL ULCERS MOST LIKELY DUE TO DRUG EFFECT. NO PATHOGNOMIC FEATURES OF IBD. HE HAS H PYLORI GASTRITIS. PT HAS A PCN ALLERGY. HE has H. Pylori gastritis. DUE TO an allergy to PCN and so HE needs PYLERA 3 PILLS QID FOR 10 DAYS. She should take OMEPRAZOLE BID for 10 days the once daily FOR 3 MOS. The meds can cause nausea, vomiting, abd cramps, loose stools, black colored stools, and metallic taste in his mouth. STRICTLY AVOID ASPIRIN, BC/GOODY POWDERS, IBUPROFEN/MOTRIN, OR NAPROXEN/ALEVE FOR 4 mos BECAUSE YOU HAVE A ULCERS IN YOUR small bowel. Continue IRON DAILY. REPEAT CBC/FERITIN 1 WEEK PRIOR TO VISIT IN NOV 2014 E30 DX: ANEMIA, H PYLORI. NEEDS CTA OF ABD & PELVIS. PT WILL CONSIDER A [facility]AL [facility]. Declines referral at this time. Would consider repeat TCS in 6 mos IF HB REMAINS < 14.

## 2013-03-27 DIAGNOSIS — K5000 Crohn's disease of small intestine without complications: Secondary | ICD-10-CM | POA: Insufficient documentation

## 2013-03-27 NOTE — Telephone Encounter (Signed)
Called patient TO DISCUSS RESULTS. Rarely takes NSAIDS. TAKES IRON PILLS. DRINKS A FAIR AMOUNT OF COFFEE. DOES NOT TAKE ANY SUPPLEMENTS. ETOH TWICE A WEEK: SMIRNOFF 24 OZ SCREWDRIVERS. NO SX OF IBD. MAY SMOKE MARIJUANA AND CIGARETTES. LAMICTAL HAS BEEN SUCCESSFUL IN TREATING HIS MOOD CHANGES FOR 6-7 YEARS. MOTHER HAS HAD PROBLEMS WITH ANEMIA.

## 2013-03-28 ENCOUNTER — Other Ambulatory Visit: Payer: Self-pay | Admitting: Gastroenterology

## 2013-03-28 ENCOUNTER — Other Ambulatory Visit: Payer: Self-pay

## 2013-03-28 DIAGNOSIS — D509 Iron deficiency anemia, unspecified: Secondary | ICD-10-CM

## 2013-03-28 DIAGNOSIS — K50018 Crohn's disease of small intestine with other complication: Secondary | ICD-10-CM

## 2013-03-28 DIAGNOSIS — D649 Anemia, unspecified: Secondary | ICD-10-CM

## 2013-03-28 NOTE — Telephone Encounter (Signed)
Patient is scheduled for Friday July 25th at 8:00 am and he is aware

## 2013-03-28 NOTE — Telephone Encounter (Signed)
REMINDER APPT MADE 

## 2013-03-28 NOTE — Telephone Encounter (Signed)
CC PCP 

## 2013-03-28 NOTE — Telephone Encounter (Signed)
Lab order on file for 07/2013.

## 2013-04-05 ENCOUNTER — Ambulatory Visit (HOSPITAL_COMMUNITY)
Admission: RE | Admit: 2013-04-05 | Discharge: 2013-04-05 | Disposition: A | Discharge status: Home / Self Care | Payer: Medicare Other | Source: Ambulatory Visit | Attending: Gastroenterology | Admitting: Gastroenterology

## 2013-04-05 DIAGNOSIS — K50018 Crohn's disease of small intestine with other complication: Secondary | ICD-10-CM

## 2013-04-05 DIAGNOSIS — K5000 Crohn's disease of small intestine without complications: Secondary | ICD-10-CM | POA: Insufficient documentation

## 2013-04-05 DIAGNOSIS — D509 Iron deficiency anemia, unspecified: Secondary | ICD-10-CM

## 2013-04-05 MED ORDER — IOHEXOL 350 MG/ML SOLN
100.0000 mL | Freq: Once | INTRAVENOUS | Status: AC | PRN
  Administered 2013-04-05: 100 mL via INTRAVENOUS

## 2013-04-16 NOTE — Telephone Encounter (Addendum)
Called patient(W) TO DISCUSS RESULTS-NO VM. SPOKE WITH PT. COMPLETED THERAPY. WONDERING ABOUT HIS MRSA STATUS YEARS AGO.  PT NEEDS PROMETHEUS IBD PANEL. WE WILL FAX ORDER TO [facility]. MAIL PT ORDER FORM. IF MARKERS ARE POS, TREAT WITH PENTASA, IF MARKERS ARE NEG: 1. PENTASA FOR 6 MOS OR 2. REFER FOR DBL BALLOON ENTEROSCOPY.

## 2013-04-17 NOTE — Telephone Encounter (Signed)
Called pt. He will come by to pick up [facility] lab order at front desk.

## 2013-04-18 ENCOUNTER — Other Ambulatory Visit: Payer: Self-pay | Admitting: Gastroenterology

## 2013-04-26 ENCOUNTER — Encounter (HOSPITAL_COMMUNITY): Admission: RE | Payer: Self-pay | Source: Ambulatory Visit

## 2013-04-26 ENCOUNTER — Ambulatory Visit (HOSPITAL_COMMUNITY): Admission: RE | Admit: 2013-04-26 | Payer: Medicare Other | Source: Ambulatory Visit | Admitting: Internal Medicine

## 2013-04-26 SURGERY — COLONOSCOPY
Anesthesia: Moderate Sedation

## 2013-05-07 NOTE — Telephone Encounter (Signed)
Called patient TO DISCUSS RESULTS. PROMETHEUS IBD PANEL MARKERS SUGGEST ULCERATIVE COLITIS WHICH IS NOT CONSISTENT WITH HIS CLINICAL PICTURE. CLINICALLY HE HAS TERMINAL ILEITIS AND A NORMAL COLON. DISCUSSED MANAGEMENT OPTIONS WITH PT. HE ASKED WHAT WOULD HAPPEN IF HE DID NOTHING. EXPLAINED HE WOULD CONTINUE TO HAVE ILEITIS AND REQUIRE IRON. RECOMMENDED THE FOLLOWING-1. PENTASA FOR 6 MOS AND IF HIS HB DOES NOT IMPROVE THEN HAVE DBE AT [facility] OR 2. GO TO DBE AT [facility]. PT WILL CALL AFTER SEP 2 TO LET ME KNOW HOW HE WOULD LIKE TO PROCEED.Marland Kitchen

## 2013-05-27 ENCOUNTER — Encounter: Payer: Self-pay | Admitting: Gastroenterology

## 2013-07-06 NOTE — Progress Notes (Signed)
OPV IN JAN OR FEB 2015 ANEMIA/ILEAL ULCER E30 SLF

## 2013-07-06 NOTE — Progress Notes (Signed)
TCS JUL 2014 NONSPECIFIC ILEAL ULCERS   Results   EGD/DIL JUL 2014 H PYLORI GASTRITIS   Results   CTA JUL 2014 NO MESENTERIC STENOSIS      REVIEWED.

## 2013-07-09 ENCOUNTER — Other Ambulatory Visit: Payer: Self-pay

## 2013-07-09 DIAGNOSIS — D649 Anemia, unspecified: Secondary | ICD-10-CM

## 2013-07-09 NOTE — Progress Notes (Signed)
Reminder in Epic 

## 2013-07-24 ENCOUNTER — Telehealth: Payer: Self-pay | Admitting: Gastroenterology

## 2013-07-24 ENCOUNTER — Encounter: Payer: Self-pay | Admitting: Gastroenterology

## 2013-07-24 DIAGNOSIS — K5000 Crohn's disease of small intestine without complications: Secondary | ICD-10-CM

## 2013-07-24 DIAGNOSIS — D649 Anemia, unspecified: Secondary | ICD-10-CM

## 2013-07-24 NOTE — Telephone Encounter (Signed)
Pt needs to have labs prior to NOV OV

## 2013-07-24 NOTE — Telephone Encounter (Signed)
Lab orders mailed to pt by [facility] on 07/09/2013. 

## 2013-07-25 ENCOUNTER — Other Ambulatory Visit: Payer: Self-pay

## 2013-07-25 ENCOUNTER — Telehealth: Payer: Self-pay

## 2013-07-25 DIAGNOSIS — D649 Anemia, unspecified: Secondary | ICD-10-CM

## 2013-07-25 DIAGNOSIS — D518 Other vitamin B12 deficiency anemias: Secondary | ICD-10-CM

## 2013-07-25 DIAGNOSIS — K5000 Crohn's disease of small intestine without complications: Secondary | ICD-10-CM

## 2013-07-25 NOTE — Telephone Encounter (Signed)
.  PLEASE CALL PT. We will add a Vitamin B12 &  FOLIC ACID TO HIS LAB ORDERS.

## 2013-07-25 NOTE — Telephone Encounter (Signed)
Pt left VM and I returned his call. He said he had some genetic testing and was told that his Folic Acid level was a little low. He wanted to let Dr. Darrick Penna know, and see if she would like to add that to the labs he will be having done. He has already received his lab orders to do, but he will not do them until he hears from Dr. Darrick Penna. He is aware she is away, and when she addresses this I will call and let him know.

## 2013-07-31 DIAGNOSIS — K5000 Crohn's disease of small intestine without complications: Secondary | ICD-10-CM | POA: Insufficient documentation

## 2013-07-31 NOTE — Telephone Encounter (Signed)
PT'S INSURANCE WILL NOT ALLOW > 1 B12 LEVEL PER YR. CONTACT PT TO SEE WHEN AND WHERE HE HAD IT MEASURED.

## 2013-07-31 NOTE — Telephone Encounter (Signed)
Pt is not sure when he last had B12. He is aware I have faxed the order for the folic acid to [facility], and he will let them know when he goes on Friday with his other orders.

## 2013-07-31 NOTE — Telephone Encounter (Signed)
LMOM for pt to call. 

## 2013-07-31 NOTE — Addendum Note (Signed)
Addended by: West Bali on: 07/31/2013 12:23 PM   Modules accepted: Orders

## 2013-08-03 LAB — CBC WITH DIFFERENTIAL/PLATELET
Basophils Absolute: 0.1 10*3/uL (ref 0.0–0.1)
Basophils Relative: 1 % (ref 0–1)
Eosinophils Absolute: 0.1 10*3/uL (ref 0.0–0.7)
Eosinophils Relative: 2 % (ref 0–5)
HCT: 38.6 % — ABNORMAL LOW (ref 39.0–52.0)
MCHC: 33.9 g/dL (ref 30.0–36.0)
MCV: 85.2 fL (ref 78.0–100.0)
Monocytes Absolute: 0.5 10*3/uL (ref 0.1–1.0)
Platelets: 220 10*3/uL (ref 150–400)
RDW: 16.1 % — ABNORMAL HIGH (ref 11.5–15.5)

## 2013-08-06 ENCOUNTER — Encounter: Payer: Self-pay | Admitting: Gastroenterology

## 2013-08-06 NOTE — Telephone Encounter (Signed)
LMOM to call.

## 2013-08-06 NOTE — Telephone Encounter (Signed)
Called and informed pt.  

## 2013-08-06 NOTE — Telephone Encounter (Signed)
PLEASE CALL PT.  HIS BLOOD COUNT IS BETTER AT 13.1 BUT NOT NORMAL. HIS RBC FOLATE LEVEL IS 439 WHICH IS NORMAL. OPV E 10 Sep 2013 ANEMIA/ILEITIS.

## 2013-08-06 NOTE — Telephone Encounter (Signed)
Pt is aware of OV on 12/18 at 10 with SF and appt card was mailed

## 2013-08-06 NOTE — Telephone Encounter (Signed)
Results Cc to PCP  

## 2013-08-29 ENCOUNTER — Other Ambulatory Visit: Payer: Self-pay | Admitting: Gastroenterology

## 2013-08-29 ENCOUNTER — Encounter (INDEPENDENT_AMBULATORY_CARE_PROVIDER_SITE_OTHER): Payer: Self-pay

## 2013-08-29 ENCOUNTER — Encounter: Payer: Self-pay | Admitting: Gastroenterology

## 2013-08-29 ENCOUNTER — Other Ambulatory Visit: Payer: Self-pay

## 2013-08-29 ENCOUNTER — Ambulatory Visit (INDEPENDENT_AMBULATORY_CARE_PROVIDER_SITE_OTHER): Payer: Medicare Other | Admitting: Gastroenterology

## 2013-08-29 VITALS — BP 118/68 | HR 58 | Temp 97.3°F | Wt 144.8 lb

## 2013-08-29 DIAGNOSIS — A048 Other specified bacterial intestinal infections: Secondary | ICD-10-CM

## 2013-08-29 DIAGNOSIS — K529 Noninfective gastroenteritis and colitis, unspecified: Secondary | ICD-10-CM

## 2013-08-29 DIAGNOSIS — B9681 Helicobacter pylori [H. pylori] as the cause of diseases classified elsewhere: Secondary | ICD-10-CM

## 2013-08-29 DIAGNOSIS — K5000 Crohn's disease of small intestine without complications: Secondary | ICD-10-CM

## 2013-08-29 HISTORY — DX: Helicobacter pylori (H. pylori) as the cause of diseases classified elsewhere: B96.81

## 2013-08-29 NOTE — Patient Instructions (Addendum)
STOP SMOKING CIGARETTES.  REPEAT CT ABD/PELVIS WITH ORAL AND IV CONTRAST TO EVALUATE YOUR SMALL BOWEL.   IF ILEITIS REMAINS, YOU SHOULD HAVE DOUBLE BALLOON ENTEROSCOPY AT [facility].  BENEFITS OF DIAGNOSIS/TREATMENT INCLUDE AVOIDING SMALL BOWEL STRICTURE, FISTULA, CANCER, OR OBSTRUCTION REQUIRING SURGICAL INTERVENTION.  TREATMENT OPTIONS FOR CROHN'S DISEASE INCLUDE: IMURAN, HUMIRA, REMICADE. TREATMENT OPTION SIDE EFFECTS OF MEDS FOR CROHN'S DISEASE INCLUDE INCREASED RISK OF INFECTION, INTESTINAL LYMPHOMA AND PROGRESSIVE MULTIFOCAL LEUKOENCEPHALOPATHY.  YOU MAY GO TO [facility] WEB SITE FOR MORE INFORMATION.   FOLLOW UP IN 6 MOS.

## 2013-08-29 NOTE — Assessment & Plan Note (Signed)
MAY STOP OMEPRAZOLE.

## 2013-08-29 NOTE — Progress Notes (Signed)
   Subjective:    Patient ID: Joshua Fuller, male    DOB: Aug 02, 1946, 67 y.o.   MRN: [id number]  Fredirick Maudlin, MD  HPI Never took PENTASA. TAKING A VITAMIN 2 DAILY FROM [facility]. BMs: GENERALLY ONCE A DAY, PRETTY NL AFTER HIS FIRST CUP OF COFFEE. ATE SUBWAY AND HAD LOOSE BOWEL. MAY HAVE "GROWLING" IN HIS STOMACH. RARE EPIGASTRIC ABD PAIN: 1X/MO. PT DENIES FEVER, CHILLS, BRBPR, nausea, vomiting, melena, diarrhea, constipation, problems swallowing, OR heartburn or indigestion. GOT MARRIED NOV 2014. SMOKES MARIJUANA AND CIGARETTES.  Past Medical History  Diagnosis Date  . Chronic mental illness     OCD  . History of MRSA infection     of the skin  . IDA (iron deficiency anemia)    Past Surgical History  Procedure Laterality Date  . Colonoscopy  02/02/2007    SLF:Two 4-mm to 6-mm rectal polyps/sigmoid colon diverticulosis and internal hemorrhoids. hyperplastic polyps. next tcs 2018  . Esophagogastroduodenoscopy   02/02/2007    BJY:NWGNF hiatal hernia closely examined and no evidence of Cameron ulcers.  Otherwise, normal esophagus. Small bowel bx negative for celiac  . Capsule endoscopy  02/19/2007    AOZ:HYQMVHQ view of gastric mucosa/Normal small bowel mucosa  . Ankle surgery    . Tonsillectomy    . Colonoscopy N/A 03/22/2013    Procedure: COLONOSCOPY;  Surgeon: West Bali, MD;  Location: [facility];  Service: Endoscopy;  Laterality: N/A;  10:45 AM  . Colonoscopy, esophagogastroduodenoscopy (egd) and esophageal dilation N/A 03/22/2013    Procedure: ESOPHAGOGASTRODUODENOSCOPY (EGD) AND ESOPHAGEAL DILATION;  Surgeon: West Bali, MD;  Location: [facility];  Service: Endoscopy;  Laterality: N/A;  Wound class       Review of Systems     Objective:   Physical Exam  Vitals reviewed. Constitutional: He is oriented to person, place, and time. He appears well-nourished.  HENT:  Head: Normocephalic and atraumatic.  Mouth/Throat: Oropharynx is clear and  moist. No oropharyngeal exudate.  Eyes: Pupils are equal, round, and reactive to light. No scleral icterus.  Neck: Normal range of motion. Neck supple.  Cardiovascular: Normal rate, regular rhythm and normal heart sounds.   Pulmonary/Chest: Effort normal and breath sounds normal. No respiratory distress.  Abdominal: Soft. Bowel sounds are normal. He exhibits no distension. There is no tenderness.  Musculoskeletal: Normal range of motion. He exhibits no edema.  Lymphadenopathy:    He has no cervical adenopathy.  Neurological: He is alert and oriented to person, place, and time.  NO FOCAL DEFICITS   Psychiatric: He has a normal mood and affect.          Assessment & Plan:

## 2013-08-29 NOTE — Assessment & Plan Note (Signed)
PT CLINICALLY ASYMPTOMATIC. CONTINUES WITH A NORMOCYTIC ANEMIAS.  REPEAT CT ABD/PELVIS WITH ORAL AND IV CONTRAST, DX: REGIONAL ILEITIS. IF ILEITIS REMAINS, PT SHOULD HAVE DBE AT [facility]. DICUSSED PROCEDURE AND BENEFITS V. RISK OF FAILING TO DIAGNOSES AND TREAT CROHN'S ILEITIS(STRICTURE, FISTULA,OBSTRUCTION REQUIRING SURGICAL INTERVENTION). DISCUSSED TREATMENT OPTIONS AND SIDE EFFECTS OF MEDS FOR CROHN'S DISEASE: IMURAN, HUMIRA, REMICADE(INTESYINAL LYMPHOMA AND PROGRESSIVE MULTIFOCAL LEUKOENCEPHALOPATHY) PT MAY GO TO [facility] AND [facility] FOR MORE INFORMATION.  FOLLOW UP IN 6 MOS.

## 2013-08-29 NOTE — Progress Notes (Signed)
cc'd to pcp 

## 2013-08-30 NOTE — Progress Notes (Signed)
Reminder in epic °

## 2013-09-13 ENCOUNTER — Ambulatory Visit (HOSPITAL_COMMUNITY)
Admission: RE | Admit: 2013-09-13 | Discharge: 2013-09-13 | Disposition: A | Discharge status: Home / Self Care | Payer: Medicare Other | Source: Ambulatory Visit | Attending: Gastroenterology | Admitting: Gastroenterology

## 2013-09-13 DIAGNOSIS — K449 Diaphragmatic hernia without obstruction or gangrene: Secondary | ICD-10-CM | POA: Insufficient documentation

## 2013-09-13 DIAGNOSIS — K5289 Other specified noninfective gastroenteritis and colitis: Secondary | ICD-10-CM | POA: Insufficient documentation

## 2013-09-13 DIAGNOSIS — K5730 Diverticulosis of large intestine without perforation or abscess without bleeding: Secondary | ICD-10-CM | POA: Insufficient documentation

## 2013-09-13 DIAGNOSIS — K529 Noninfective gastroenteritis and colitis, unspecified: Secondary | ICD-10-CM

## 2013-09-13 DIAGNOSIS — R933 Abnormal findings on diagnostic imaging of other parts of digestive tract: Secondary | ICD-10-CM | POA: Insufficient documentation

## 2013-09-13 MED ORDER — IOHEXOL 300 MG/ML  SOLN
100.0000 mL | Freq: Once | INTRAMUSCULAR | Status: AC | PRN
  Administered 2013-09-13: 100 mL via INTRAVENOUS

## 2013-09-16 ENCOUNTER — Telehealth: Payer: Self-pay | Admitting: Gastroenterology

## 2013-09-16 NOTE — Telephone Encounter (Signed)
PT RETURNED [facility]. EXPLAINED RESULTS. DBE AT [facility] FOR TERMINAL ILEITIS ETIOLOGY UNKNOWN. EVALUATE FOR CROHN'S DISEASE OR LYMPHOMA. PT PREFERS A FRI APPT.

## 2013-09-16 NOTE — Telephone Encounter (Signed)
Called patient TO DISCUSS RESULTS. CALL [phone] TO DISCUSS. PT NEEDS DBE AT [facility] TO ATTEMPT TO OBTAIN A TISSUE DIAGNOSIS.

## 2013-09-16 NOTE — Telephone Encounter (Signed)
Referral has been faxed to [facility] along with his records

## 2013-09-26 NOTE — Telephone Encounter (Signed)
Mr. Sawa has a appointment at [facility] w/Dr. Arsenio Loader on February 25th, 2015 at 9:00

## 2013-11-06 DIAGNOSIS — J439 Emphysema, unspecified: Secondary | ICD-10-CM | POA: Insufficient documentation

## 2013-11-06 DIAGNOSIS — J449 Chronic obstructive pulmonary disease, unspecified: Secondary | ICD-10-CM | POA: Insufficient documentation

## 2013-11-06 DIAGNOSIS — K529 Noninfective gastroenteritis and colitis, unspecified: Secondary | ICD-10-CM

## 2013-11-06 HISTORY — DX: Noninfective gastroenteritis and colitis, unspecified: K52.9

## 2014-01-29 ENCOUNTER — Encounter (INDEPENDENT_AMBULATORY_CARE_PROVIDER_SITE_OTHER): Payer: Self-pay

## 2014-01-29 ENCOUNTER — Telehealth: Payer: Self-pay | Admitting: Gastroenterology

## 2014-01-29 ENCOUNTER — Ambulatory Visit (INDEPENDENT_AMBULATORY_CARE_PROVIDER_SITE_OTHER): Payer: Medicare Other | Admitting: Gastroenterology

## 2014-01-29 ENCOUNTER — Encounter: Payer: Self-pay | Admitting: Gastroenterology

## 2014-01-29 VITALS — BP 107/67 | HR 53 | Temp 98.1°F | Ht 68.0 in | Wt 131.4 lb

## 2014-01-29 DIAGNOSIS — R634 Abnormal weight loss: Secondary | ICD-10-CM

## 2014-01-29 DIAGNOSIS — K5000 Crohn's disease of small intestine without complications: Secondary | ICD-10-CM | POA: Insufficient documentation

## 2014-01-29 NOTE — Telephone Encounter (Signed)
I have a signed release from patient to get records from [facility]. What all are you needing for [place] to get?

## 2014-01-29 NOTE — Progress Notes (Signed)
    Primary Care Physician: Alonza Bogus, MD  Primary Gastroenterologist:  Barney Drain, MD   Chief Complaint  Patient presents with  . Crohn's Disease    HPI: Joshua Fuller is a 68 y.o. male here for follow up. H/O ileitis. [facility] IBD panel markers s/o UC but not consistent with clinical picture as he had normal colon and terminal ileitis. CT 08/2013 showed 20cm segment of circumferential bowel wall thickening in the distal ileum to TI. Sent to [facility] for DBE. Per patient no DBE offered, told he had Crohn's. Advised to follow up with [place].   Lost 12 to 13 pounds since 6 months ago. Paleo diet, trying to eat healthy. Drinks a lot of carrot juice. BM regular, formed. Rare diarrhea. No melena, brbpr. Stomach growling has resolved. Occasionally has some discomfort in RLQ, but not a pain. Single spot underneath left rib cage painful. Rare difficulty swallowing chicken. No NSAIDS/ASA.  Current Outpatient Prescriptions  Medication Sig Dispense Refill  . lamoTRIgine (LAMICTAL) 100 MG tablet Take 100 mg by mouth daily.       . Multiple Vitamin (MULTIVITAMIN) capsule Take 1 capsule by mouth daily.      . NON FORMULARY Iron     Daily ( not sure mg)      . Vilazodone HCl (VIIBRYD) 40 MG TABS Take 40 mg by mouth daily. Said he takes 30 mg daily by cutting the pill in half and taking half and then 1/2 of the other half       No current facility-administered medications for this visit.    Allergies as of 01/29/2014 - Review Complete 01/29/2014  Allergen Reaction Noted  . Penicillins Rash 01/18/2013    ROS:  General: Negative for anorexia, weight loss, fever, chills, fatigue, weakness. ENT: Negative for hoarseness, difficulty swallowing , nasal congestion. CV: Negative for chest pain, angina, palpitations, dyspnea on exertion, peripheral edema.  Respiratory: Negative for dyspnea at rest, dyspnea on exertion, cough, sputum, wheezing.  GI: See history of present illness. GU:  Negative  for dysuria, hematuria, urinary incontinence, urinary frequency, nocturnal urination.  Endo: Negative for unusual weight change.    Physical Examination:   BP 107/67  Pulse 53  Temp(Src) 98.1 F (36.7 C) (Oral)  Ht 5\' 8"  (1.727 m)  Wt 131 lb 6.4 oz (59.603 kg)  BMI 19.98 kg/m2  General: Well-nourished, well-developed in no acute distress.  Eyes: No icterus. Mouth: Oropharyngeal mucosa moist and pink , no lesions erythema or exudate. Lungs: Clear to auscultation bilaterally.  Heart: Regular rate and rhythm, no murmurs rubs or gallops.  Abdomen: Bowel sounds are normal, nontender, nondistended, no hepatosplenomegaly or masses, no abdominal bruits or hernia , no rebound or guarding.   Extremities: No lower extremity edema. No clubbing or deformities. Neuro: Alert and oriented x 4   Skin: Warm and dry, no jaundice.   Psych: Alert and cooperative, normal mood and affect.  Labs:  Lab Results  Component Value Date   WBC 7.6 08/02/2013   HGB 13.1 08/02/2013   HCT 38.6* 08/02/2013   MCV 85.2 08/02/2013   PLT 220 08/02/2013   Lab Results  Component Value Date   FERRITIN 21* 08/02/2013     Imaging Studies: No results found.

## 2014-01-29 NOTE — Patient Instructions (Addendum)
1. Please look at information regarding Imuran, Humira on Crohn's and colitis foundation website. These are likely best options for you but other options also include Pentasa and Entocort.  2. I will obtain records from [facility] and review with Dr. Oneida Alar. We will call you with further input sometime in the next one week. 3. Please pay attention to your caloric intake. You are at a healthy weight and should avoid further weight loss.   Crohn's Disease Crohn's disease is a long-term (chronic) soreness and redness (inflammation) of the intestines (bowel). It can affect any portion of the digestive tract, from the mouth to the anus. It can also cause problems outside the digestive tract. Crohn's disease is closely related to a disease called ulcerative colitis (together, these two diseases are called inflammatory bowel disease).  CAUSES  The cause of Crohn's disease is not known. One Link Snuffer is that, in an easily affected person, the immune system is triggered to attack the body's own digestive tissue. Crohn's disease runs in families. It seems to be more common in certain geographic areas and amongst certain races. There are no clear-cut dietary causes.  SYMPTOMS  Crohn's disease can cause many different symptoms since it can affect many different parts of the body. Symptoms include:  Fatigue.  Weight loss.  Chronic diarrhea, sometime bloody.  Abdominal pain and cramps.  Fever.  Ulcers or canker sores in the mouth or rectum.  Anemia (low red blood cells).  Arthritis, skin problems, and eye problems may occur. Complications of Crohn's disease can include:  Series of holes (perforation) of the bowel.  Portions of the intestines sticking to each other (adhesions).  Obstruction of the bowel.  Fistula formation, typically in the rectal area but also in other areas. A fistula is an opening between the bowels and the outside, or between the bowels and another organ.  A painful crack in the  mucous membrane of the anus (rectal fissure). DIAGNOSIS  Your caregiver may suspect Crohn's disease based on your symptoms and an exam. Blood tests may confirm that there is a problem. You may be asked to submit a stool specimen for examination. X-rays and CT scans may be necessary. Ultimately, the diagnosis is usually made after a procedure that uses a flexible tube that is inserted via your mouth or your anus. This is done under sedation and is called either an upper endoscopy or colonoscopy. With these tests, the specialist can take tiny tissue samples and remove them from the inside of the bowel (biopsy). Examination of this biopsy tissue under a microscope can reveal Crohn's disease as the cause of your symptoms. Due to the many different forms that Crohn's disease can take, symptoms may be present for several years before a diagnosis is made. TREATMENT  Medications are often used to decrease inflammation and control the immune system. These include medicines related to aspirin, steroid medications, and newer and stronger medications to slow down the immune system. Some medications may be used as suppositories or enemas. A number of other medications are used or have been studied. Your caregiver will make specific recommendations. HOME CARE INSTRUCTIONS   Symptoms such as diarrhea can be controlled with medications. Avoid foods that have a laxative effect such as fresh fruit, vegetables and dairy products. During flare ups, you can rest your bowel by refraining from solid foods. Drink clear liquids frequently during the day (electrolyte or re-hydrating fluids are best. Your caregiver can help you with suggestions). Drink often to prevent loss of body  fluids (dehydration). When diarrhea has cleared, eat small meals and more frequently. Avoid food additives and stimulants such as caffeine (coffee, tea, or chocolate). Enzyme supplements may help if you develop intolerance to a sugar in dairy products  (lactose). Ask your caregiver or dietitian about specific dietary instructions.  Try to maintain a positive attitude. Learn relaxation techniques such as self hypnosis, mental imaging, and muscle relaxation.  If possible, avoid stresses which can aggravate your condition.  Exercise regularly.  Follow your diet.  Always get plenty of rest. SEEK MEDICAL CARE IF:   Your symptoms fail to improve after a week or two of new treatment.  You experience continued weight loss.  You have ongoing cramps or loose bowels.  You develop a new skin rash, skin sores, or eye problems. SEEK IMMEDIATE MEDICAL CARE IF:   You have worsening of your symptoms or develop new symptoms.  You have a fever.  You develop bloody diarrhea.  You develop severe abdominal pain. MAKE SURE YOU:   Understand these instructions.  Will watch your condition.  Will get help right away if you are not doing well or get worse. Document Released: 06/08/2005 Document Revised: 12/24/2012 Document Reviewed: 05/07/2007 [facility] Patient Information 2014 [place], [place].

## 2014-01-29 NOTE — Telephone Encounter (Signed)
I am going to try to access thru [facility] first.

## 2014-01-31 NOTE — Assessment & Plan Note (Signed)
Clinically picture most c/w Crohn's. Review records from [facility]. Patient's degree of symptoms limited even with long segment of ileum involved based on CT. Currently he has not been treatmented. To discuss further with Dr. Oneida Alar after review of records from [facility]. Discussed treatment options with patient including pentasa, imuran, anti-TNF agents.

## 2014-02-04 NOTE — Progress Notes (Addendum)
Reviewed records from Dr. Arsenio Loader. Notes be scanned.  Dr. 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 Lymphoma felt to be unlikely. Double balloon enteroscopy was not offered.  Labs from February revealed hemoglobin 13.3, hematocrit 40.8, MCV 89.8, LFTs normal. Dr. Arsenio Loader recommended TTG to rule out celiac disease given IDA but this was not done.  Dr. Arsenio Loader recommended following up with Dr. Oneida Alar to begin mesalamine therapy.   To discuss findings with Dr. Oneida Alar. Suspect initiating Pentasa vs Imuran in near future.

## 2014-02-05 NOTE — Progress Notes (Signed)
cc'd to pcp 

## 2014-02-25 NOTE — Progress Notes (Signed)
REVIEWED. RECOMMEND BIOLOGIC AGENT OR IMURAN.

## 2014-02-26 NOTE — Progress Notes (Signed)
Please let patient know that Dr. Oneida Alar recommends Humira injections or Imuran. Please find out if patient willing to start treatment, if so I will call him and discuss options further. We discussed at length during his OV and he was not sure if he wanted to start meds yet.

## 2014-03-05 ENCOUNTER — Encounter: Payer: Self-pay | Admitting: Gastroenterology

## 2014-03-05 NOTE — Progress Notes (Signed)
LMOM to call.

## 2014-03-05 NOTE — Progress Notes (Signed)
Please see my noted from 02/10/14 (addendum). Have we contacted patient regarding SLF recommendations?

## 2014-03-12 NOTE — Progress Notes (Signed)
I called pt. He still does not know which he should do. He said he doesn't have a lot of symptoms at this time.  He still has questions and would like a call from Neil Crouch, PA at her convenience. He is aware that she is on vacation this week and it will be sometime next week before she can call him and that is OK.

## 2014-04-09 NOTE — Progress Notes (Signed)
Doris, I'm just now getting around to this. Patient has been very reluctant to start meds for Crohn's. I discussed with Dr. Oneida Alar, and she suggested seeing her to discuss. Patient questions everything said or suggested by me.   Please change his upcoming appt in 04/2014 to SLF. Hopefully he will come to a decision at that time.

## 2014-04-10 ENCOUNTER — Encounter: Payer: Self-pay | Admitting: Gastroenterology

## 2014-04-10 NOTE — Progress Notes (Signed)
Pt is aware and said it is OK to cancel appt with Neil Crouch, PA in August and make appt with Dr. Oneida Alar.

## 2014-04-10 NOTE — Progress Notes (Signed)
i cancelled OV with LSL and [place] with [facility] for 05/14/14 at 0930 and mailed appt card

## 2014-05-09 ENCOUNTER — Ambulatory Visit: Payer: Medicare Other | Admitting: Gastroenterology

## 2014-05-14 ENCOUNTER — Ambulatory Visit (INDEPENDENT_AMBULATORY_CARE_PROVIDER_SITE_OTHER): Payer: Medicare Other | Admitting: Gastroenterology

## 2014-05-14 ENCOUNTER — Encounter (INDEPENDENT_AMBULATORY_CARE_PROVIDER_SITE_OTHER): Payer: Self-pay

## 2014-05-14 ENCOUNTER — Encounter: Payer: Self-pay | Admitting: Gastroenterology

## 2014-05-14 VITALS — BP 103/58 | HR 54 | Temp 97.4°F | Ht 68.0 in | Wt 128.4 lb

## 2014-05-14 DIAGNOSIS — K5000 Crohn's disease of small intestine without complications: Secondary | ICD-10-CM

## 2014-05-14 DIAGNOSIS — K50019 Crohn's disease of small intestine with unspecified complications: Secondary | ICD-10-CM

## 2014-05-14 NOTE — Assessment & Plan Note (Addendum)
[place]. ASSOCIATED WITH WEIGHT LOSS AND LUQ PAIN  PT DECLINES THERAPY. CT ABD W/ IVC OPV IN 6 MOS

## 2014-05-14 NOTE — Progress Notes (Signed)
Cc to pcp °

## 2014-05-14 NOTE — Progress Notes (Signed)
Subjective:    Patient ID: Joshua Fuller, male    DOB: [birth year], 68 y.o.   MRN: [id number]  Joshua Bogus, MD  HPI SON HAD COLITIS. HAVING A SHARP PAIN IN LEFT SIDE. [place] SPECIALIST FELT HE HAD A MASS HE WANTS ME TO CHECK IT. DIARRHEA IF HIS DIET IS NOT CORRECT-OCCASIONAL(<1X/MO). BMs: DAILY-NL. A LITTLE OLD COFFEE IN THE AM DOES THE TRICK. ATTRIBUTES WEIGHT LOSS TO PALEO DIET AND EATING MORE VEGETABLES. Pt not interested in treatment for his Crohn's disease. LAST LABS WITH [facility].   PT DENIES FEVER, CHILLS, HEMATOCHEZIA, HEMATEMESIS, nausea, vomiting, melena, CHEST PAIN, SHORTNESS OF BREATH, CHANGE IN BOWEL IN HABITS, problems swallowing,OR heartburn or indigestion.   Past Surgical History  Procedure Laterality Date  . Colonoscopy  02/02/2007    SLF:Two 4-mm to 6-mm rectal polyps/sigmoid colon diverticulosis and internal hemorrhoids. hyperplastic polyps.   . Esophagogastroduodenoscopy   02/02/2007    KPT:WSFKC hiatal hernia closely examined and no evidence of Cameron ulcers.  Otherwise, normal esophagus. Small bowel bx negative for celiac  . Capsule endoscopy  02/19/2007    LEX:NTZGYFV view of gastric mucosa/Normal small bowel mucosa  . Ankle surgery    . Tonsillectomy    . Colonoscopy N/A 03/22/2013    SLF: ANEMIA MOST LIKELY DUE TO ULCERS IN terminal ileum/Moderate diverticulosis  in the sigmoid colon/ONE RECTAL polyp removed(hyperplastic). ileum bx with focal active ileitis. next TCS 03/2023 with overtube  . Colonoscopy, esophagogastroduodenoscopy (egd) and esophageal dilation N/A 03/22/2013    CBS:WHQPRFFMB DUE Stricture at the gastroesophageal junction/Large hiatal hernia/MILD Non-erosive gastritis & DUODENITIS (+h.pylori)   Past Surgical History  Procedure Laterality Date  . Colonoscopy  02/02/2007    SLF:Two 4-mm to 6-mm rectal polyps/sigmoid colon diverticulosis and internal hemorrhoids. hyperplastic polyps.   . Esophagogastroduodenoscopy   02/02/2007    WGY:KZLDJ hiatal  hernia closely examined and no evidence of Cameron ulcers.  Otherwise, normal esophagus. Small bowel bx negative for celiac  . Capsule endoscopy  02/19/2007    TTS:VXBLTJQ view of gastric mucosa/Normal small bowel mucosa  . Ankle surgery    . Tonsillectomy    . Colonoscopy N/A 03/22/2013    SLF: ANEMIA MOST LIKELY DUE TO ULCERS IN terminal ileum/Moderate diverticulosis  in the sigmoid colon/ONE RECTAL polyp removed(hyperplastic). ileum bx with focal active ileitis. next TCS 03/2023 with overtube  . Colonoscopy, esophagogastroduodenoscopy (egd) and esophageal dilation N/A 03/22/2013    ZES:PQZRAQTMA DUE Stricture at the gastroesophageal junction/Large hiatal hernia/MILD Non-erosive gastritis & DUODENITIS (+h.pylori)   Allergies  Allergen Reactions  . Penicillins Rash   Current Outpatient Prescriptions  Medication Sig Dispense Refill  . lamoTRIgine (LAMICTAL) 100 MG tablet Take 200 mg by mouth daily.       . Multiple Vitamin (MULTIVITAMIN) capsule Take 1 capsule by mouth daily.      . NON FORMULARY Iron     Daily ( not sure mg)      . Vilazodone HCl (VIIBRYD) 40 MG TABS Take 40 mg by mouth daily. Said he takes 30 mg daily by cutting the pill in half and taking half and then 1/2 of the other half          Review of Systems     Objective:   Physical Exam  Vitals reviewed. Constitutional: He is oriented to person, place, and time. He appears well-developed and well-nourished. No distress.  HENT:  Head: Normocephalic and atraumatic.  Mouth/Throat: Oropharynx is clear and moist. No oropharyngeal exudate.  Eyes: Pupils are equal, round, and  reactive to light. No scleral icterus.  Neck: Normal range of motion. Neck supple.  Cardiovascular: Normal rate, regular rhythm and normal heart sounds.   Pulmonary/Chest: Effort normal and breath sounds normal. No respiratory distress.  Abdominal: Soft. Bowel sounds are normal. He exhibits no distension and no mass. There is tenderness. There is no  rebound and no guarding.  Moderate ttp near left subcostal margin  Musculoskeletal: He exhibits no edema.  Lymphadenopathy:    He has no cervical adenopathy.  Neurological: He is alert and oriented to person, place, and time.  NO FOCAL DEFICITS   Psychiatric: He has a normal mood and affect.          Assessment & Plan:

## 2014-05-14 NOTE — Patient Instructions (Signed)
SINCE MAY 2014 YOUR WEIGHT LOSS IS SIGNIFICANT: 144 LBS TO 128 LBS. YOU SHOULD CONSIDER THERAPY FOR YOUR CROHN'S ILEITIS.  COMPLETE CT SCAN.  PLEASE BRING A COPY OF YOUR RECENT LABS.  FOLLOW UP IN 6 MOS. MERRY CHRISTMAS AND HAPPY NEW YEAR!

## 2014-05-14 NOTE — Progress Notes (Signed)
PATIENT NIC'D FOR 6 MONTH F/U

## 2014-05-16 ENCOUNTER — Encounter (HOSPITAL_COMMUNITY): Payer: Self-pay

## 2014-05-16 ENCOUNTER — Ambulatory Visit (HOSPITAL_COMMUNITY)
Admission: RE | Admit: 2014-05-16 | Discharge: 2014-05-16 | Disposition: A | Discharge status: Home / Self Care | Payer: Medicare Other | Source: Ambulatory Visit | Attending: Gastroenterology | Admitting: Gastroenterology

## 2014-05-16 DIAGNOSIS — F17200 Nicotine dependence, unspecified, uncomplicated: Secondary | ICD-10-CM | POA: Insufficient documentation

## 2014-05-16 DIAGNOSIS — K5289 Other specified noninfective gastroenteritis and colitis: Secondary | ICD-10-CM | POA: Insufficient documentation

## 2014-05-16 DIAGNOSIS — R1012 Left upper quadrant pain: Secondary | ICD-10-CM | POA: Diagnosis present

## 2014-05-16 DIAGNOSIS — R634 Abnormal weight loss: Secondary | ICD-10-CM | POA: Diagnosis not present

## 2014-05-16 DIAGNOSIS — K50019 Crohn's disease of small intestine with unspecified complications: Secondary | ICD-10-CM

## 2014-05-16 DIAGNOSIS — K449 Diaphragmatic hernia without obstruction or gangrene: Secondary | ICD-10-CM | POA: Insufficient documentation

## 2014-05-16 LAB — POCT I-STAT CREATININE: Creatinine, Ser: 1.1 mg/dL (ref 0.50–1.35)

## 2014-05-16 MED ORDER — IOHEXOL 300 MG/ML  SOLN
100.0000 mL | Freq: Once | INTRAMUSCULAR | Status: AC | PRN
  Administered 2014-05-16: 100 mL via INTRAVENOUS

## 2014-05-22 NOTE — Progress Notes (Addendum)
PLEASE CALL PT. HIS CT SHOWS A NORMAL SPLEEN. HE DOES NOT HAVE ANY SIGNIFICANT FINDINGS ON THE CT OF HIS ABDOMEN. THIS CT DID NOT INCLUDE HIS PELVIS BECAUSE WE WERE JUST LOOKING AT HIS SPLEEN AND A REASON FOR HIS LUQ PAIN.Joshua Fuller

## 2014-05-22 NOTE — Progress Notes (Signed)
LMOM to call.

## 2014-05-26 ENCOUNTER — Telehealth: Payer: Self-pay

## 2014-05-26 NOTE — Progress Notes (Signed)
See phone note of 05/26/2014.

## 2014-05-26 NOTE — Telephone Encounter (Signed)
Called and informed the pt.  

## 2014-05-26 NOTE — Telephone Encounter (Signed)
PLEASE CALL PT. HIS CT WOULD HAVE SHOWED A TUMOR. HIS PAIN IS MOST LIKELY MUSCULOSKELETAL IN ORIGIN.  HE NEEDS TO SE HIS PCP AND DISCUSS IF PHYSICAL THERAPY WOULD HELP.

## 2014-05-26 NOTE — Telephone Encounter (Signed)
Pt called for results of his CT. ( See progress notes of 05/22/2014). He is aware but is also still concerned that he can still feel a hot spot under his left ribs when he touches with his fingers. He said that he has not pain unless he touches that area. He would like to know if there had been a tumor in that area would it have shown up. He is still concerned and would like recommendations.

## 2014-10-10 ENCOUNTER — Encounter: Payer: Self-pay | Admitting: Gastroenterology

## 2015-10-09 DIAGNOSIS — F331 Major depressive disorder, recurrent, moderate: Secondary | ICD-10-CM | POA: Diagnosis not present

## 2015-10-27 DIAGNOSIS — Z23 Encounter for immunization: Secondary | ICD-10-CM | POA: Diagnosis not present

## 2016-01-01 DIAGNOSIS — F331 Major depressive disorder, recurrent, moderate: Secondary | ICD-10-CM | POA: Diagnosis not present

## 2016-02-19 DIAGNOSIS — F331 Major depressive disorder, recurrent, moderate: Secondary | ICD-10-CM | POA: Diagnosis not present

## 2016-06-04 DIAGNOSIS — F331 Major depressive disorder, recurrent, moderate: Secondary | ICD-10-CM | POA: Diagnosis not present

## 2016-07-23 DIAGNOSIS — F331 Major depressive disorder, recurrent, moderate: Secondary | ICD-10-CM | POA: Diagnosis not present

## 2016-10-15 DIAGNOSIS — Z23 Encounter for immunization: Secondary | ICD-10-CM | POA: Diagnosis not present

## 2016-12-02 DIAGNOSIS — N401 Enlarged prostate with lower urinary tract symptoms: Secondary | ICD-10-CM | POA: Diagnosis not present

## 2016-12-02 DIAGNOSIS — E46 Unspecified protein-calorie malnutrition: Secondary | ICD-10-CM | POA: Diagnosis not present

## 2016-12-02 DIAGNOSIS — J449 Chronic obstructive pulmonary disease, unspecified: Secondary | ICD-10-CM | POA: Diagnosis not present

## 2016-12-02 DIAGNOSIS — I10 Essential (primary) hypertension: Secondary | ICD-10-CM | POA: Diagnosis not present

## 2016-12-23 DIAGNOSIS — F330 Major depressive disorder, recurrent, mild: Secondary | ICD-10-CM | POA: Diagnosis not present

## 2016-12-23 DIAGNOSIS — F39 Unspecified mood [affective] disorder: Secondary | ICD-10-CM | POA: Diagnosis not present

## 2016-12-23 DIAGNOSIS — F429 Obsessive-compulsive disorder, unspecified: Secondary | ICD-10-CM | POA: Diagnosis not present

## 2017-03-10 DIAGNOSIS — I10 Essential (primary) hypertension: Secondary | ICD-10-CM | POA: Diagnosis not present

## 2017-03-10 DIAGNOSIS — N401 Enlarged prostate with lower urinary tract symptoms: Secondary | ICD-10-CM | POA: Diagnosis not present

## 2017-03-10 DIAGNOSIS — K5090 Crohn's disease, unspecified, without complications: Secondary | ICD-10-CM | POA: Diagnosis not present

## 2017-03-10 DIAGNOSIS — J449 Chronic obstructive pulmonary disease, unspecified: Secondary | ICD-10-CM | POA: Diagnosis not present

## 2017-03-29 DIAGNOSIS — C44519 Basal cell carcinoma of skin of other part of trunk: Secondary | ICD-10-CM | POA: Diagnosis not present

## 2017-04-07 DIAGNOSIS — F39 Unspecified mood [affective] disorder: Secondary | ICD-10-CM | POA: Diagnosis not present

## 2017-04-07 DIAGNOSIS — F330 Major depressive disorder, recurrent, mild: Secondary | ICD-10-CM | POA: Diagnosis not present

## 2017-04-07 DIAGNOSIS — N529 Male erectile dysfunction, unspecified: Secondary | ICD-10-CM | POA: Diagnosis not present

## 2017-04-07 DIAGNOSIS — F429 Obsessive-compulsive disorder, unspecified: Secondary | ICD-10-CM | POA: Diagnosis not present

## 2017-05-03 DIAGNOSIS — Z08 Encounter for follow-up examination after completed treatment for malignant neoplasm: Secondary | ICD-10-CM | POA: Diagnosis not present

## 2017-05-03 DIAGNOSIS — Z85828 Personal history of other malignant neoplasm of skin: Secondary | ICD-10-CM | POA: Diagnosis not present

## 2017-05-03 DIAGNOSIS — D225 Melanocytic nevi of trunk: Secondary | ICD-10-CM | POA: Diagnosis not present

## 2017-08-11 DIAGNOSIS — F39 Unspecified mood [affective] disorder: Secondary | ICD-10-CM | POA: Diagnosis not present

## 2017-08-11 DIAGNOSIS — F330 Major depressive disorder, recurrent, mild: Secondary | ICD-10-CM | POA: Diagnosis not present

## 2017-08-11 DIAGNOSIS — F429 Obsessive-compulsive disorder, unspecified: Secondary | ICD-10-CM | POA: Diagnosis not present

## 2017-08-11 DIAGNOSIS — N529 Male erectile dysfunction, unspecified: Secondary | ICD-10-CM | POA: Diagnosis not present

## 2017-09-08 DIAGNOSIS — I10 Essential (primary) hypertension: Secondary | ICD-10-CM | POA: Diagnosis not present

## 2017-09-08 DIAGNOSIS — Z23 Encounter for immunization: Secondary | ICD-10-CM | POA: Diagnosis not present

## 2017-09-08 DIAGNOSIS — F17200 Nicotine dependence, unspecified, uncomplicated: Secondary | ICD-10-CM | POA: Diagnosis not present

## 2017-09-08 DIAGNOSIS — K5090 Crohn's disease, unspecified, without complications: Secondary | ICD-10-CM | POA: Diagnosis not present

## 2017-09-08 DIAGNOSIS — J449 Chronic obstructive pulmonary disease, unspecified: Secondary | ICD-10-CM | POA: Diagnosis not present

## 2017-11-01 DIAGNOSIS — Z08 Encounter for follow-up examination after completed treatment for malignant neoplasm: Secondary | ICD-10-CM | POA: Diagnosis not present

## 2017-11-01 DIAGNOSIS — Z1283 Encounter for screening for malignant neoplasm of skin: Secondary | ICD-10-CM | POA: Diagnosis not present

## 2017-11-01 DIAGNOSIS — Z85828 Personal history of other malignant neoplasm of skin: Secondary | ICD-10-CM | POA: Diagnosis not present

## 2017-11-01 DIAGNOSIS — L570 Actinic keratosis: Secondary | ICD-10-CM | POA: Diagnosis not present

## 2017-11-01 DIAGNOSIS — X32XXXA Exposure to sunlight, initial encounter: Secondary | ICD-10-CM | POA: Diagnosis not present

## 2017-11-17 ENCOUNTER — Ambulatory Visit (HOSPITAL_COMMUNITY)
Admission: RE | Admit: 2017-11-17 | Discharge: 2017-11-17 | Disposition: A | Discharge status: Home / Self Care | Payer: Medicare Other | Source: Ambulatory Visit | Attending: Pulmonary Disease | Admitting: Pulmonary Disease

## 2017-11-17 ENCOUNTER — Other Ambulatory Visit (HOSPITAL_COMMUNITY): Payer: Self-pay | Admitting: Pulmonary Disease

## 2017-11-17 DIAGNOSIS — J449 Chronic obstructive pulmonary disease, unspecified: Secondary | ICD-10-CM | POA: Diagnosis not present

## 2017-11-17 DIAGNOSIS — E46 Unspecified protein-calorie malnutrition: Secondary | ICD-10-CM | POA: Diagnosis not present

## 2017-11-17 DIAGNOSIS — K449 Diaphragmatic hernia without obstruction or gangrene: Secondary | ICD-10-CM | POA: Diagnosis not present

## 2017-11-17 DIAGNOSIS — I10 Essential (primary) hypertension: Secondary | ICD-10-CM | POA: Diagnosis not present

## 2017-11-17 DIAGNOSIS — F17200 Nicotine dependence, unspecified, uncomplicated: Secondary | ICD-10-CM | POA: Diagnosis not present

## 2017-12-04 ENCOUNTER — Other Ambulatory Visit (HOSPITAL_COMMUNITY): Payer: Self-pay | Admitting: Pulmonary Disease

## 2017-12-04 DIAGNOSIS — R0602 Shortness of breath: Secondary | ICD-10-CM

## 2017-12-08 DIAGNOSIS — F39 Unspecified mood [affective] disorder: Secondary | ICD-10-CM | POA: Diagnosis not present

## 2017-12-08 DIAGNOSIS — N529 Male erectile dysfunction, unspecified: Secondary | ICD-10-CM | POA: Diagnosis not present

## 2017-12-08 DIAGNOSIS — F330 Major depressive disorder, recurrent, mild: Secondary | ICD-10-CM | POA: Diagnosis not present

## 2017-12-08 DIAGNOSIS — F429 Obsessive-compulsive disorder, unspecified: Secondary | ICD-10-CM | POA: Diagnosis not present

## 2017-12-22 ENCOUNTER — Ambulatory Visit (HOSPITAL_COMMUNITY)
Admission: RE | Admit: 2017-12-22 | Discharge: 2017-12-22 | Disposition: A | Discharge status: Home / Self Care | Payer: Medicare Other | Source: Ambulatory Visit | Attending: Pulmonary Disease | Admitting: Pulmonary Disease

## 2017-12-22 ENCOUNTER — Encounter (HOSPITAL_COMMUNITY): Payer: Self-pay

## 2017-12-22 DIAGNOSIS — M6289 Other specified disorders of muscle: Secondary | ICD-10-CM | POA: Insufficient documentation

## 2017-12-22 DIAGNOSIS — K449 Diaphragmatic hernia without obstruction or gangrene: Secondary | ICD-10-CM | POA: Insufficient documentation

## 2017-12-22 DIAGNOSIS — J984 Other disorders of lung: Secondary | ICD-10-CM | POA: Diagnosis not present

## 2017-12-22 DIAGNOSIS — I700 Atherosclerosis of aorta: Secondary | ICD-10-CM | POA: Diagnosis not present

## 2017-12-22 DIAGNOSIS — I2510 Atherosclerotic heart disease of native coronary artery without angina pectoris: Secondary | ICD-10-CM | POA: Diagnosis not present

## 2017-12-22 DIAGNOSIS — R0602 Shortness of breath: Secondary | ICD-10-CM | POA: Insufficient documentation

## 2017-12-22 DIAGNOSIS — J8410 Pulmonary fibrosis, unspecified: Secondary | ICD-10-CM | POA: Diagnosis not present

## 2017-12-22 DIAGNOSIS — J439 Emphysema, unspecified: Secondary | ICD-10-CM | POA: Insufficient documentation

## 2017-12-22 LAB — POCT I-STAT CREATININE: CREATININE: 1 mg/dL (ref 0.61–1.24)

## 2017-12-22 MED ORDER — IOPAMIDOL (ISOVUE-300) INJECTION 61%
75.0000 mL | Freq: Once | INTRAVENOUS | Status: AC | PRN
  Administered 2017-12-22: 75 mL via INTRAVENOUS

## 2017-12-27 ENCOUNTER — Other Ambulatory Visit (HOSPITAL_COMMUNITY): Payer: Self-pay | Admitting: Pulmonary Disease

## 2017-12-27 DIAGNOSIS — R1900 Intra-abdominal and pelvic swelling, mass and lump, unspecified site: Secondary | ICD-10-CM

## 2018-01-01 ENCOUNTER — Other Ambulatory Visit (HOSPITAL_COMMUNITY): Payer: Self-pay | Admitting: Pulmonary Disease

## 2018-01-01 ENCOUNTER — Ambulatory Visit (HOSPITAL_COMMUNITY)
Admission: RE | Admit: 2018-01-01 | Discharge: 2018-01-01 | Disposition: A | Discharge status: Home / Self Care | Payer: Medicare Other | Source: Ambulatory Visit | Attending: Pulmonary Disease | Admitting: Pulmonary Disease

## 2018-01-01 DIAGNOSIS — R1902 Left upper quadrant abdominal swelling, mass and lump: Secondary | ICD-10-CM | POA: Diagnosis not present

## 2018-01-01 DIAGNOSIS — R1900 Intra-abdominal and pelvic swelling, mass and lump, unspecified site: Secondary | ICD-10-CM

## 2018-01-26 ENCOUNTER — Other Ambulatory Visit (HOSPITAL_COMMUNITY): Payer: Self-pay | Admitting: Pulmonary Disease

## 2018-01-26 DIAGNOSIS — R1902 Left upper quadrant abdominal swelling, mass and lump: Secondary | ICD-10-CM

## 2018-02-02 ENCOUNTER — Ambulatory Visit: Payer: Medicare Other | Admitting: Cardiovascular Disease

## 2018-02-08 ENCOUNTER — Other Ambulatory Visit: Payer: Self-pay | Admitting: Student

## 2018-02-09 ENCOUNTER — Ambulatory Visit (HOSPITAL_COMMUNITY)
Admission: RE | Admit: 2018-02-09 | Discharge: 2018-02-09 | Disposition: A | Discharge status: Home / Self Care | Payer: Medicare Other | Source: Ambulatory Visit | Attending: Pulmonary Disease | Admitting: Pulmonary Disease

## 2018-02-09 DIAGNOSIS — R1902 Left upper quadrant abdominal swelling, mass and lump: Secondary | ICD-10-CM | POA: Insufficient documentation

## 2018-02-09 DIAGNOSIS — D509 Iron deficiency anemia, unspecified: Secondary | ICD-10-CM | POA: Diagnosis not present

## 2018-02-09 DIAGNOSIS — Z79899 Other long term (current) drug therapy: Secondary | ICD-10-CM | POA: Insufficient documentation

## 2018-02-09 DIAGNOSIS — F429 Obsessive-compulsive disorder, unspecified: Secondary | ICD-10-CM | POA: Diagnosis not present

## 2018-02-09 DIAGNOSIS — Z8614 Personal history of Methicillin resistant Staphylococcus aureus infection: Secondary | ICD-10-CM | POA: Diagnosis not present

## 2018-02-09 DIAGNOSIS — F17210 Nicotine dependence, cigarettes, uncomplicated: Secondary | ICD-10-CM | POA: Diagnosis not present

## 2018-02-09 DIAGNOSIS — R1900 Intra-abdominal and pelvic swelling, mass and lump, unspecified site: Secondary | ICD-10-CM | POA: Diagnosis not present

## 2018-02-09 DIAGNOSIS — R222 Localized swelling, mass and lump, trunk: Secondary | ICD-10-CM | POA: Diagnosis not present

## 2018-02-09 LAB — CBC
HCT: 37.1 % — ABNORMAL LOW (ref 39.0–52.0)
HEMOGLOBIN: 12 g/dL — AB (ref 13.0–17.0)
MCH: 30.5 pg (ref 26.0–34.0)
MCHC: 32.3 g/dL (ref 30.0–36.0)
MCV: 94.2 fL (ref 78.0–100.0)
PLATELETS: 197 10*3/uL (ref 150–400)
RBC: 3.94 MIL/uL — AB (ref 4.22–5.81)
RDW: 14.6 % (ref 11.5–15.5)
WBC: 5.6 10*3/uL (ref 4.0–10.5)

## 2018-02-09 LAB — APTT: aPTT: 33 seconds (ref 24–36)

## 2018-02-09 LAB — PROTIME-INR
INR: 1.09
PROTHROMBIN TIME: 14 s (ref 11.4–15.2)

## 2018-02-09 MED ORDER — SODIUM CHLORIDE 0.9 % IV SOLN: INTRAVENOUS | Status: DC

## 2018-02-09 MED ORDER — LIDOCAINE HCL (PF) 1 % IJ SOLN
INTRAMUSCULAR | Status: AC
  Filled 2018-02-09: qty 30

## 2018-02-09 NOTE — H&P (Signed)
Chief Complaint: Patient was seen in consultation today for biopsy of abdominal wall nodule at the request of Hawkins,Edward  Referring Physician(s): Hawkins,Edward  Supervising Physician: Jacqulynn Cadet  Patient Status: [facility] - Out-pt  History of Present Illness: Joshua Fuller is a 72 y.o. male found to have a painful nodule of the LUQ abd wall. After multiple imaging studies, he is referred for US guided bx PMHx, meds, labs, imaging, allergies reviewed. Feels well, no recent fevers, chills, illness. Has been NPO today as directed. Family at bedside.   Past Medical History:  Diagnosis Date  . Chronic mental illness    OCD  . History of MRSA infection    of the skin  . IDA (iron deficiency anemia)     Past Surgical History:  Procedure Laterality Date  . ANKLE SURGERY    . Capsule endoscopy  02/19/2007   DGU:YQIHKVQ view of gastric mucosa/Normal small bowel mucosa  . COLONOSCOPY  02/02/2007   SLF:Two 4-mm to 6-mm rectal polyps/sigmoid colon diverticulosis and internal hemorrhoids. hyperplastic polyps.   . COLONOSCOPY N/A 03/22/2013   SLF: ANEMIA MOST LIKELY DUE TO ULCERS IN terminal ileum/Moderate diverticulosis  in the sigmoid colon/ONE RECTAL polyp removed(hyperplastic). ileum bx with focal active ileitis. next TCS 03/2023 with overtube  . COLONOSCOPY, ESOPHAGOGASTRODUODENOSCOPY (EGD) AND ESOPHAGEAL DILATION N/A 03/22/2013   QVZ:DGLOVFIEP DUE Stricture at the gastroesophageal junction/Large hiatal hernia/MILD Non-erosive gastritis & DUODENITIS (+h.pylori)  . ESOPHAGOGASTRODUODENOSCOPY   02/02/2007   PIR:JJOAC hiatal hernia closely examined and no evidence of Cameron ulcers.  Otherwise, normal esophagus. Small bowel bx negative for celiac  . TONSILLECTOMY      Allergies: Penicillins  Medications: Prior to Admission medications   Medication Sig Start Date End Date Taking? Authorizing Provider  ferrous sulfate 325 (65 FE) MG EC tablet Take 325 mg by mouth daily  with breakfast.   Yes [provider]  lamoTRIgine (LAMICTAL) 100 MG tablet Take 100 mg by mouth daily.    Yes [provider]  lisinopril (PRINIVIL,ZESTRIL) 10 MG tablet Take 10 mg by mouth daily.   Yes [provider]  Multiple Vitamin (MULTIVITAMIN) capsule Take 1 capsule by mouth daily.   Yes [provider]  tadalafil (CIALIS) 5 MG tablet Take 5 mg by mouth daily.   Yes [provider]  Vilazodone HCl (VIIBRYD) 40 MG TABS Take 20 mg by mouth daily.    Yes [provider]     Family History  Problem Relation Age of Onset  . Colon cancer Neg Hx     Social History   Socioeconomic History  . Marital status: Married    Spouse name: Not on file  . Number of children: 4  . Years of education: Not on file  . Highest education level: Not on file  Occupational History  . 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 Financial resource strain: Not on file  . Food insecurity:    Worry: Not on file    Inability: Not on file  . Transportation needs:    Medical: Not on file    Non-medical: Not on file  Tobacco Use  . Smoking status: Current Every Day Smoker    Types: Cigarettes  . Tobacco comment: Smokes 7-8 cigarettes daily   Substance and Sexual Activity  . Alcohol use: Yes    Comment: Couple glasses of wine per week  . Drug use: No  . Sexual activity: Not on file  Lifestyle  .  Physical activity:    Days per week: Not on file    Minutes per session: Not on file  . Stress: Not on file  Relationships  . Social connections:    Talks on phone: Not on file    Gets together: Not on file    Attends religious service: Not on file    Active member of club or organization: Not on file    Attends meetings of clubs or organizations: Not on file    Relationship status: Not on file  Other Topics Concern  . Not on file  Social History Narrative   Retired from [employer].       Review of Systems: A 12  point ROS discussed and pertinent positives are indicated in the HPI above.  All other systems are negative.  Review of Systems  Vital Signs: BP 104/73 (BP Location: Right Arm)   Pulse (!) 54   Temp 98 F (36.7 C) (Oral)   Resp 18   Ht 5\' 8"  (1.727 m)   Wt 135 lb (61.2 kg)   SpO2 98%   BMI 20.53 kg/m   Physical Exam  Constitutional: He is oriented to person, place, and time. He appears well-developed. No distress.  HENT:  Head: Normocephalic.  Mouth/Throat: Oropharynx is clear and moist.  Neck: Normal range of motion. No JVD present. No tracheal deviation present.  Cardiovascular: Normal rate, regular rhythm and normal heart sounds.  Pulmonary/Chest: Effort normal and breath sounds normal. No respiratory distress.  Abdominal: Soft.  Palpable firm nodule LUQ just under the costal border  Neurological: He is alert and oriented to person, place, and time.  Skin: Skin is warm and dry.  Psychiatric: He has a normal mood and affect.    Imaging: No results found.  Labs:  CBC: Recent Labs    02/09/18 1133  WBC 5.6  HGB 12.0*  HCT 37.1*  PLT 197    COAGS: Recent Labs    02/09/18 1133  INR 1.09  APTT 33    BMP: Recent Labs    12/22/17 0819  CREATININE 1.00    LIVER FUNCTION TESTS: No results for input(s): BILITOT, AST, ALT, ALKPHOS, PROT, ALBUMIN in the last 8760 hours.  TUMOR MARKERS: No results for input(s): AFPTM, CEA, CA199, CHROMGRNA in the last 8760 hours.  Assessment and Plan: LUQ abdominal wall nodule For US guided core biopsy Labs ok Risks and benefits discussed with the patient including, but not limited to bleeding, infection, damage to adjacent structures or low yield requiring additional tests.  All of the patient's questions were answered, patient is agreeable to proceed. Consent signed and in chart.    Thank you for this interesting consult.  I greatly enjoyed meeting Joshua Fuller and look forward to participating in their care.   A copy of this report was sent to the requesting provider on this date.  Electronically Signed: Ascencion Dike, PA-C 02/09/2018, 1:04 PM   I spent a total of 20 minutes in face to face in clinical consultation, greater than 50% of which was counseling/coordinating care for nodule bx

## 2018-02-09 NOTE — Progress Notes (Signed)
Biopsy completed. Pt discharge home walking with family. Awake and alert. In no distress. Pt to keep area dry with band aid on until tomorrow, may remove then. Pt verbalizes understanding instructions.

## 2018-02-09 NOTE — Procedures (Signed)
Interventional Radiology Procedure Note  Procedure: US guided core biopsy LLQ abd wall soft tissue nodule.   Complications: None  Estimated Blood Loss: None  Recommendations: - DC Home  Signed,  Criselda Peaches, MD

## 2018-02-13 ENCOUNTER — Telehealth: Payer: Self-pay | Admitting: Cardiology

## 2018-02-13 ENCOUNTER — Encounter: Payer: Self-pay | Admitting: *Deleted

## 2018-02-13 ENCOUNTER — Encounter: Payer: Self-pay | Admitting: Cardiology

## 2018-02-13 ENCOUNTER — Ambulatory Visit (INDEPENDENT_AMBULATORY_CARE_PROVIDER_SITE_OTHER): Payer: Medicare Other | Admitting: Cardiology

## 2018-02-13 VITALS — BP 129/75 | HR 51 | Ht 68.0 in | Wt 136.2 lb

## 2018-02-13 DIAGNOSIS — R001 Bradycardia, unspecified: Secondary | ICD-10-CM

## 2018-02-13 DIAGNOSIS — I2510 Atherosclerotic heart disease of native coronary artery without angina pectoris: Secondary | ICD-10-CM | POA: Diagnosis not present

## 2018-02-13 NOTE — Patient Instructions (Signed)
Your physician recommends that you schedule a follow-up appointment in: TO BE DETERMINED WITH DR BRANCH  Your physician recommends that you continue on your current medications as directed. Please refer to the Current Medication list given to you today.  Your physician has requested that you have en exercise stress myoview. For further information please visit [email or web address]. Please follow instruction sheet, as given.  Thank you for choosing [facility]!!    

## 2018-02-13 NOTE — Telephone Encounter (Signed)
Pre-cert Verification for the following procedure    EXERCISE STRESS scheduled for 02/23/2018 at [facility]

## 2018-02-13 NOTE — Progress Notes (Signed)
Clinical Summary Joshua Fuller is a 72 y.o.male seen as new consult, referred for CAD by Dr Luan Pulling.   1. CAD - incidental finding of CAD by recent CT scan - 12/2017 chest CT: coronary calcifications, aortic atherosclerosis - no recent chest pain. No recent SOB/DOE.  CAD risk factors: HTN, tobacco off and on several years.   2. Bradycardia - no recent symptoms  Past Medical History:  Diagnosis Date  . Chronic mental illness    OCD  . History of MRSA infection    of the skin  . IDA (iron deficiency anemia)      Allergies  Allergen Reactions  . Penicillins Rash    Has patient had a PCN reaction causing immediate rash, facial/tongue/throat swelling, SOB or lightheadedness with hypotension: yes Has patient had a PCN reaction causing severe rash involving mucus membranes or skin necrosis: no Has patient had a PCN reaction that required hospitalization: no Has patient had a PCN reaction occurring within the last 10 years: no If all of the above answers are "NO", then may proceed with Cephalosporin use.      Current Outpatient Medications  Medication Sig Dispense Refill  . ferrous sulfate 325 (65 FE) MG EC tablet Take 325 mg by mouth daily with breakfast.    . lamoTRIgine (LAMICTAL) 100 MG tablet Take 100 mg by mouth daily.     Marland Kitchen lisinopril (PRINIVIL,ZESTRIL) 10 MG tablet Take 10 mg by mouth daily.    . Multiple Vitamin (MULTIVITAMIN) capsule Take 1 capsule by mouth daily.    . tadalafil (CIALIS) 5 MG tablet Take 5 mg by mouth daily.    . Vilazodone HCl (VIIBRYD) 40 MG TABS Take 20 mg by mouth daily.      No current facility-administered medications for this visit.      Past Surgical History:  Procedure Laterality Date  . ANKLE SURGERY    . Capsule endoscopy  02/19/2007   YOV:ZCHYIFO view of gastric mucosa/Normal small bowel mucosa  . COLONOSCOPY  02/02/2007   SLF:Two 4-mm to 6-mm rectal polyps/sigmoid colon diverticulosis and internal hemorrhoids. hyperplastic  polyps.   . COLONOSCOPY N/A 03/22/2013   SLF: ANEMIA MOST LIKELY DUE TO ULCERS IN terminal ileum/Moderate diverticulosis  in the sigmoid colon/ONE RECTAL polyp removed(hyperplastic). ileum bx with focal active ileitis. next TCS 03/2023 with overtube  . COLONOSCOPY, ESOPHAGOGASTRODUODENOSCOPY (EGD) AND ESOPHAGEAL DILATION N/A 03/22/2013   YDX:AJOINOMVE DUE Stricture at the gastroesophageal junction/Large hiatal hernia/MILD Non-erosive gastritis & DUODENITIS (+h.pylori)  . ESOPHAGOGASTRODUODENOSCOPY   02/02/2007   [facility]:NOBSJ hiatal hernia closely examined and no evidence of Cameron ulcers.  Otherwise, normal esophagus. Small bowel bx negative for celiac  . TONSILLECTOMY       Allergies  Allergen Reactions  . Penicillins Rash    Has patient had a PCN reaction causing immediate rash, facial/tongue/throat swelling, SOB or lightheadedness with hypotension: yes Has patient had a PCN reaction causing severe rash involving mucus membranes or skin necrosis: no Has patient had a PCN reaction that required hospitalization: no Has patient had a PCN reaction occurring within the last 10 years: no If all of the above answers are "NO", then may proceed with Cephalosporin use.       Family History  Problem Relation Age of Onset  . Colon cancer Neg Hx      Social History Mr. Monje reports that he has been smoking cigarettes.  He does not have any smokeless tobacco history on file. Mr. Brafford reports that he drinks alcohol.  Review of Systems CONSTITUTIONAL: No weight loss, fever, chills, weakness or fatigue.  HEENT: Eyes: No visual loss, blurred vision, double vision or yellow sclerae.No hearing loss, sneezing, congestion, runny nose or sore throat.  SKIN: No rash or itching.  CARDIOVASCULAR: per hpi RESPIRATORY: No shortness of breath, cough or sputum.  GASTROINTESTINAL: No anorexia, nausea, vomiting or diarrhea. No abdominal pain or blood.  GENITOURINARY: No burning on urination, no  polyuria NEUROLOGICAL: No headache, dizziness, syncope, paralysis, ataxia, numbness or tingling in the extremities. No change in bowel or bladder control.  MUSCULOSKELETAL: No muscle, back pain, joint pain or stiffness.  LYMPHATICS: No enlarged nodes. No history of splenectomy.  PSYCHIATRIC: No history of depression or anxiety.  ENDOCRINOLOGIC: No reports of sweating, cold or heat intolerance. No polyuria or polydipsia.  Marland Kitchen   Physical Examination Vitals:   02/13/18 0909  BP: 129/75  Pulse: (!) 51  SpO2: 100%   Vitals:   02/13/18 0909  Weight: 136 lb 3.2 oz (61.8 kg)  Height: 5\' 8"  (1.727 m)    Gen: resting comfortably, no acute distress HEENT: no scleral icterus, pupils equal round and reactive, no palptable cervical adenopathy,  CV: RRR, no mr/g, no jvd Resp: Clear to auscultation bilaterally GI: abdomen is soft, non-tender, non-distended, normal bowel sounds, no hepatosplenomegaly MSK: extremities are warm, no edema.  Skin: warm, no rash Neuro:  no focal deficits Psych: appropriate affect     Assessment and Plan  1. CAD - incidental finding on recent CT scan. The functionality of these lesions is unclear - we will obtain an exercise nuclear stress test to better evaluate for functional ischemia - if functional CAD confirmed consider ASA and statin.   2. Bradycardia - sinus brady at 50 today by EKG in clinic. No symptoms. Not on av nodal agents - continue to monitor at this time. F/u chronotropic response of upcoming stress test.    F/u pending stress results   Arnoldo Lenis, M.D..

## 2018-02-22 ENCOUNTER — Other Ambulatory Visit: Payer: Self-pay | Admitting: Cardiovascular Disease

## 2018-02-23 ENCOUNTER — Ambulatory Visit (HOSPITAL_COMMUNITY)
Admission: RE | Admit: 2018-02-23 | Discharge: 2018-02-23 | Disposition: A | Discharge status: Home / Self Care | Payer: Medicare Other | Source: Ambulatory Visit | Attending: Cardiovascular Disease | Admitting: Cardiovascular Disease

## 2018-02-23 ENCOUNTER — Encounter (HOSPITAL_BASED_OUTPATIENT_CLINIC_OR_DEPARTMENT_OTHER)
Admission: RE | Admit: 2018-02-23 | Discharge: 2018-02-23 | Disposition: A | Discharge status: Home / Self Care | Payer: Medicare Other | Source: Ambulatory Visit | Attending: Cardiovascular Disease | Admitting: Cardiovascular Disease

## 2018-02-23 DIAGNOSIS — I2510 Atherosclerotic heart disease of native coronary artery without angina pectoris: Secondary | ICD-10-CM

## 2018-02-23 LAB — NM MYOCAR MULTI W/SPECT W/WALL MOTION / EF
CHL CUP MPHR: 149 {beats}/min
CHL CUP NUCLEAR SRS: 3
CHL CUP NUCLEAR SSS: 4
CHL CUP RESTING HR STRESS: 49 {beats}/min
CHL RATE OF PERCEIVED EXERTION: 14
CSEPED: 10 min
CSEPEDS: 2 s
CSEPHR: 87 %
Estimated workload: 10.1 METS
LV dias vol: 107 mL (ref 62–150)
LV sys vol: 47 mL
Peak HR: 131 {beats}/min
RATE: 0.27
SDS: 1
TID: 0.92

## 2018-02-23 MED ORDER — SODIUM CHLORIDE 0.9% FLUSH
INTRAVENOUS | Status: AC
  Administered 2018-02-23: 10 mL via INTRAVENOUS
  Filled 2018-02-23: qty 10

## 2018-02-23 MED ORDER — TECHNETIUM TC 99M TETROFOSMIN IV KIT
10.0000 | PACK | Freq: Once | INTRAVENOUS | Status: AC | PRN
  Administered 2018-02-23: 9.8 via INTRAVENOUS

## 2018-02-23 MED ORDER — TECHNETIUM TC 99M TETROFOSMIN IV KIT
30.0000 | PACK | Freq: Once | INTRAVENOUS | Status: AC | PRN
  Administered 2018-02-23: 29.5 via INTRAVENOUS

## 2018-02-23 MED ORDER — REGADENOSON 0.4 MG/5ML IV SOLN
INTRAVENOUS | Status: AC
  Filled 2018-02-23: qty 5

## 2018-05-18 DIAGNOSIS — F39 Unspecified mood [affective] disorder: Secondary | ICD-10-CM | POA: Diagnosis not present

## 2018-05-18 DIAGNOSIS — F429 Obsessive-compulsive disorder, unspecified: Secondary | ICD-10-CM | POA: Diagnosis not present

## 2018-05-18 DIAGNOSIS — F330 Major depressive disorder, recurrent, mild: Secondary | ICD-10-CM | POA: Diagnosis not present

## 2018-05-18 DIAGNOSIS — N529 Male erectile dysfunction, unspecified: Secondary | ICD-10-CM | POA: Diagnosis not present

## 2018-06-15 DIAGNOSIS — R001 Bradycardia, unspecified: Secondary | ICD-10-CM | POA: Diagnosis not present

## 2018-06-15 DIAGNOSIS — F17200 Nicotine dependence, unspecified, uncomplicated: Secondary | ICD-10-CM | POA: Diagnosis not present

## 2018-06-15 DIAGNOSIS — I10 Essential (primary) hypertension: Secondary | ICD-10-CM | POA: Diagnosis not present

## 2018-06-15 DIAGNOSIS — Z23 Encounter for immunization: Secondary | ICD-10-CM | POA: Diagnosis not present

## 2018-06-15 DIAGNOSIS — J449 Chronic obstructive pulmonary disease, unspecified: Secondary | ICD-10-CM | POA: Diagnosis not present

## 2018-06-18 IMAGING — CT CT CHEST W/ CM
2 of 4 series · 15 of 36 positions shown, 18 images · IV contrast (iopamidol)
Comparison: None.

CLINICAL DATA: Pt has knot on lt upper abd, noted to be painful.
(marked with BB) Smoking hx [REDACTED] on and off for years

EXAM:
CT CHEST WITH CONTRAST
TECHNIQUE: Multidetector CT imaging of the chest was performed during
intravenous contrast administration.
CONTRAST:  75mL NIC9LR-UTT IOPAMIDOL (NIC9LR-UTT) INJECTION 61%

[Series 2: axial st · axial · 0.65mm/px · z∈[+1128,+1464]mm · 12 of 198 slices shown, 15 images]
[im 15/198  mediastinal]
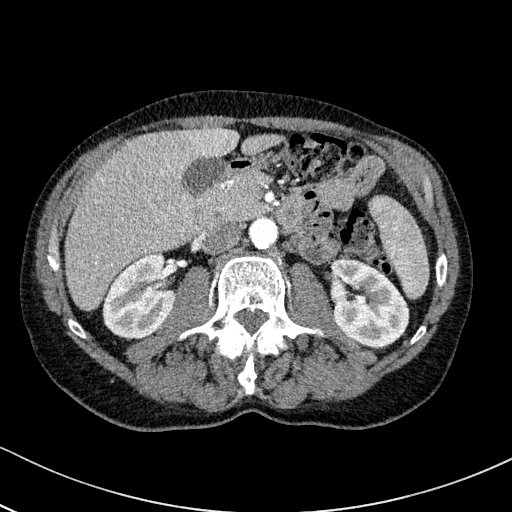
[im 15/198  lung]
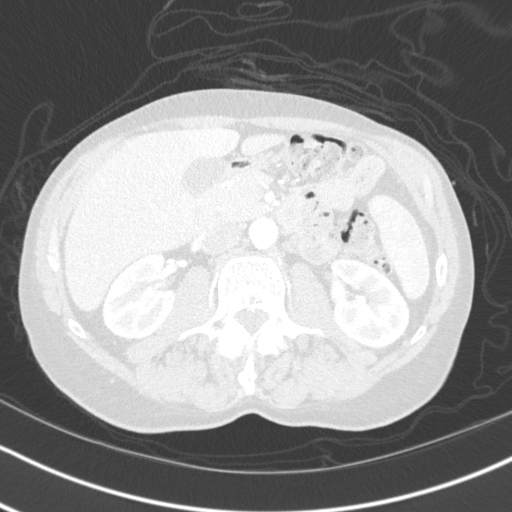
[im 29/198  lung]
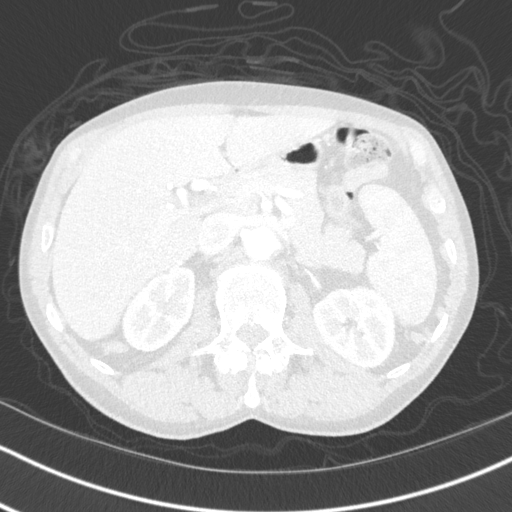
[im 43/198  lung]
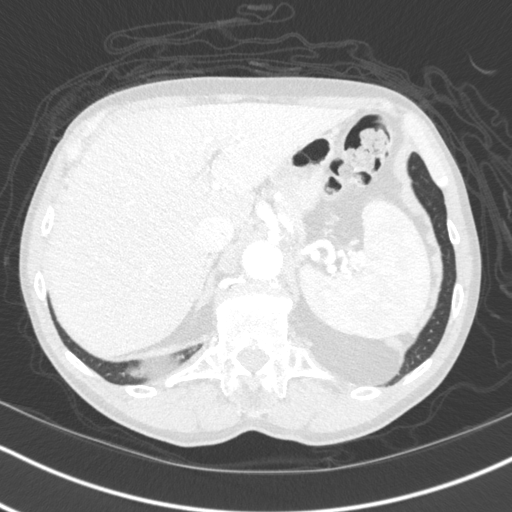
[im 57/198  lung]
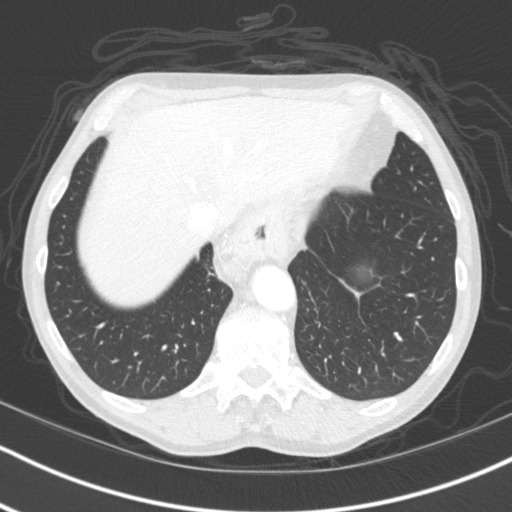
[im 71/198  mediastinal]
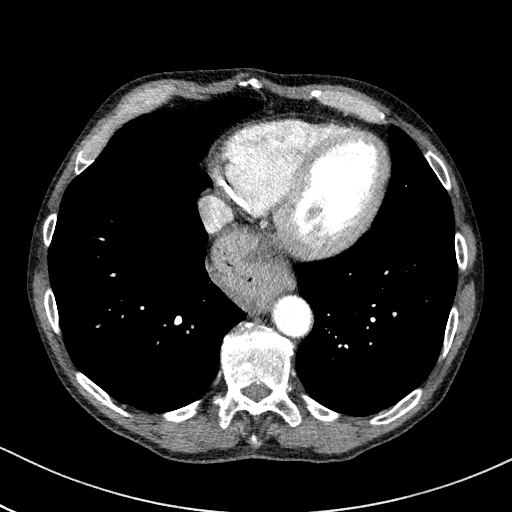
[im 71/198  lung]
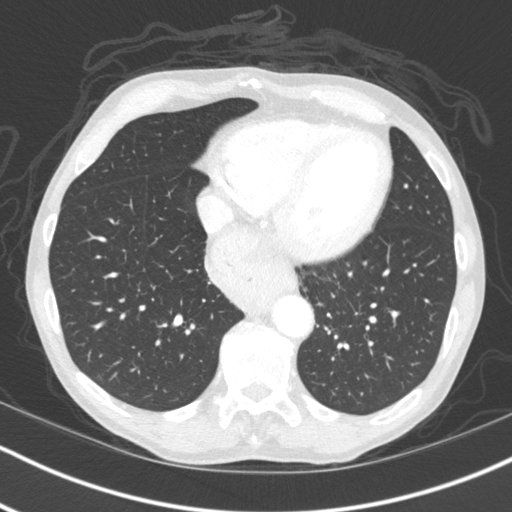
[im 85/198  lung]
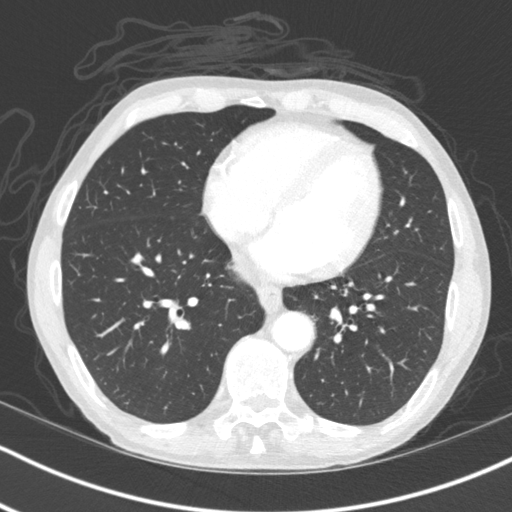
[im 113/198  lung]
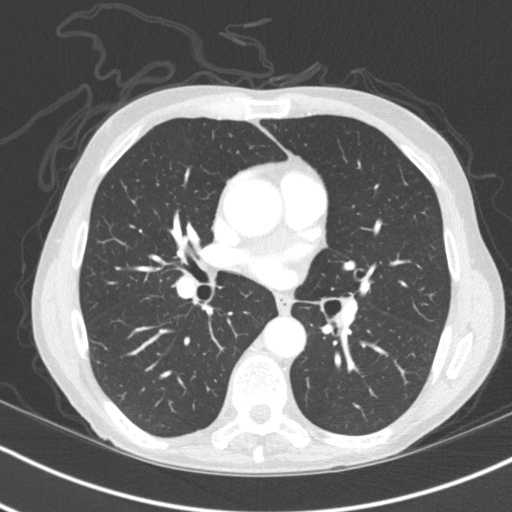
[im 127/198  lung]
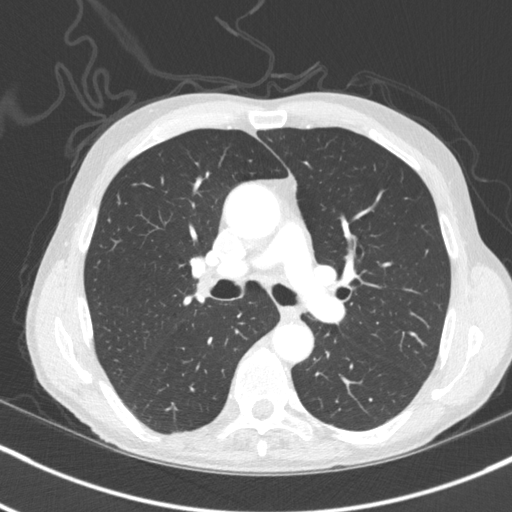
[im 141/198  mediastinal]
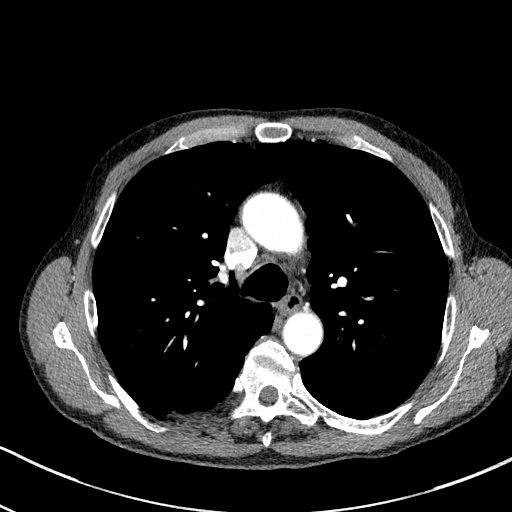
[im 141/198  lung]
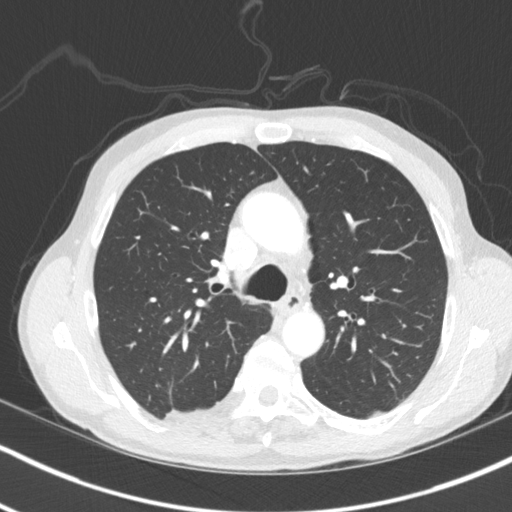
[im 155/198  lung]
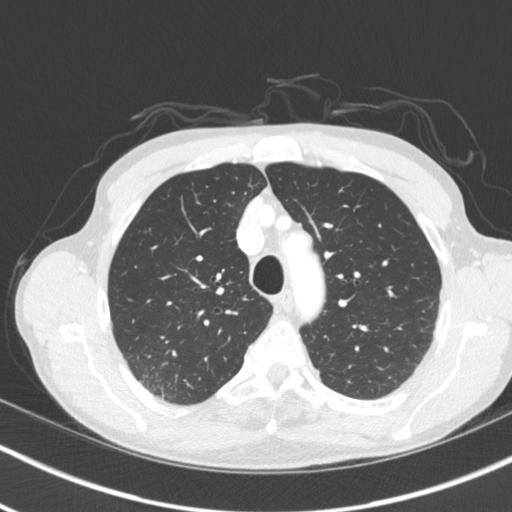
[im 169/198  lung]
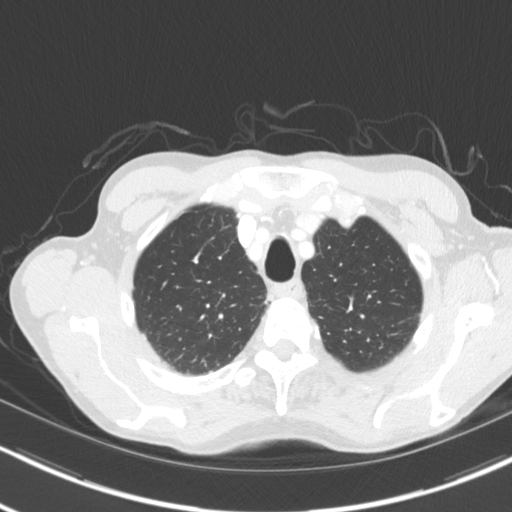
[im 183/198  lung]
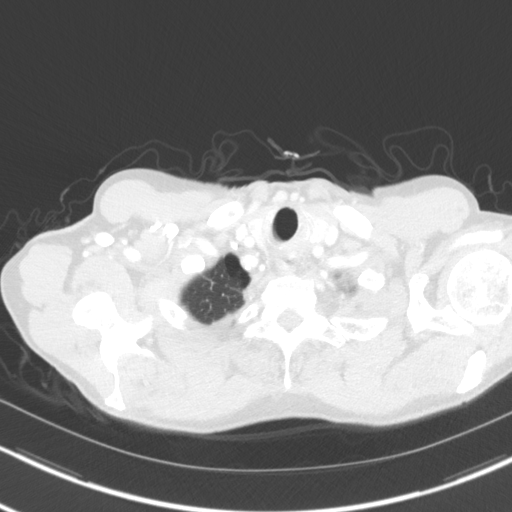

[Series 5: coronal · coronal · 0.70mm/px · 3 of 129 slices shown]
[im 26/129  lung]
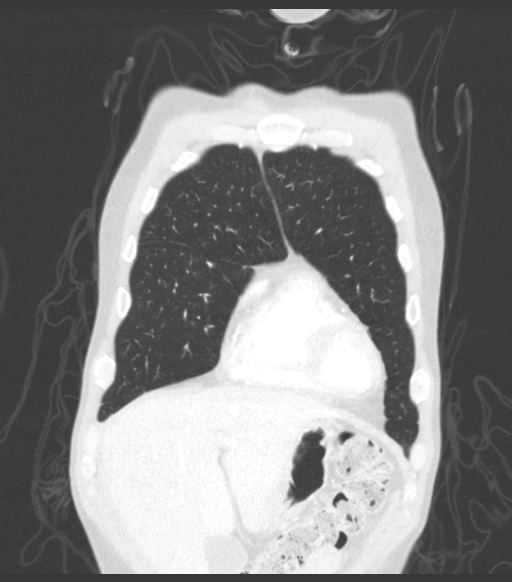
[im 52/129  lung]
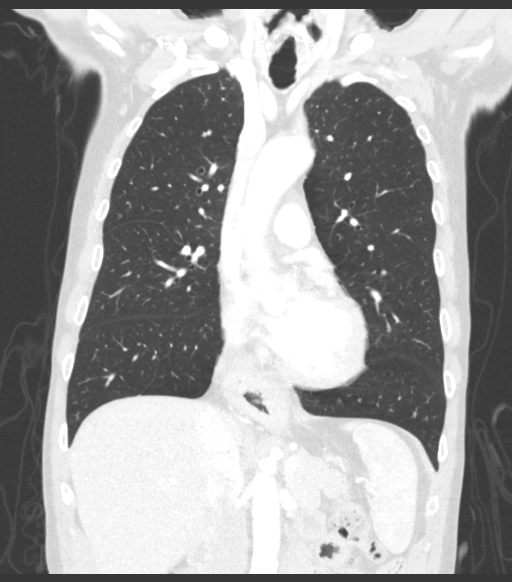
[im 77/129  lung]
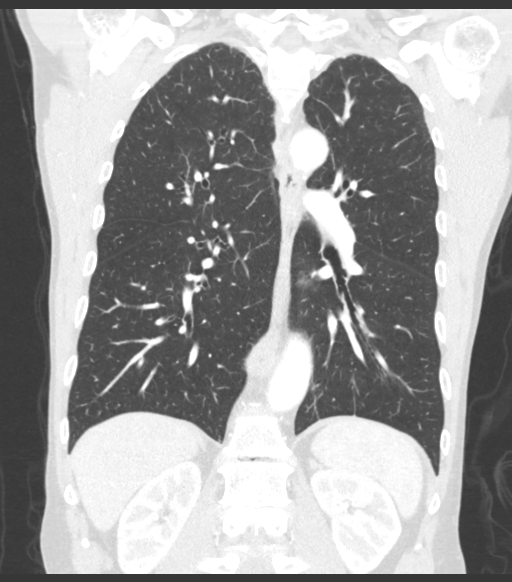

[15 of 36 positions shown; findings below may reference images not displayed]

FINDINGS: Cardiovascular: Heart size is normal. No pericardial effusion.
Scattered coronary artery calcifications. Scattered mild
atherosclerosis of the normal caliber thoracic aorta.

Mediastinum/Nodes: Hiatal hernia, moderate to large in size. No mass
or enlarged lymph nodes seen within the mediastinum or perihilar
regions. Trachea and central bronchi are unremarkable.

Lungs/Pleura: Small emphysematous blebs at each lung apex. Mild
pleuroparenchymal scarring/fibrosis within the upper lobes
bilaterally. Lungs otherwise clear. No pleural effusion.

Upper Abdomen: Limited images of the upper intra-abdominal
structures are unremarkable.

Musculoskeletal: There is focal masslike thickening within the LEFT
abdominal wall rectus musculature, superficial portion of the
musculature, measuring approximately 2 x 1.2 cm, corresponding to
the area of clinical concern with overlying skin marker in place.

Degenerative changes throughout the slightly scoliotic thoracolumbar
spine, mild to moderate in degree. No acute or suspicious osseous
finding. LEFT lower ribs are unremarkable.
IMPRESSION: 1. Focal hypodense masslike thickening within the LEFT abdominal
wall rectus musculature, superficial portion of the musculature,
measuring approximately 2 cm greatest dimension, corresponding to
the area of clinical concern with overlying skin marker in place.
Neoplastic process is a possibility. Recommend directed soft tissue
ultrasound for further characterization. MRI or tissue sampling may
eventually become necessary.
2. No other acute or suspicious finding.
3. Hiatal hernia, moderate to large in size.
4. Mild emphysematous change within the upper lobes bilaterally,
with associated mild pleuroparenchymal scarring/fibrosis.
5. Aortic atherosclerosis.
6. Coronary artery calcifications.

Aortic Atherosclerosis (0QHRV-D29.9) and Emphysema (0QHRV-1YK.4).

## 2018-06-20 DIAGNOSIS — X32XXXD Exposure to sunlight, subsequent encounter: Secondary | ICD-10-CM | POA: Diagnosis not present

## 2018-06-20 DIAGNOSIS — B078 Other viral warts: Secondary | ICD-10-CM | POA: Diagnosis not present

## 2018-06-20 DIAGNOSIS — L570 Actinic keratosis: Secondary | ICD-10-CM | POA: Diagnosis not present

## 2018-06-20 DIAGNOSIS — Z85828 Personal history of other malignant neoplasm of skin: Secondary | ICD-10-CM | POA: Diagnosis not present

## 2018-06-20 DIAGNOSIS — Z08 Encounter for follow-up examination after completed treatment for malignant neoplasm: Secondary | ICD-10-CM | POA: Diagnosis not present

## 2018-08-24 DIAGNOSIS — F330 Major depressive disorder, recurrent, mild: Secondary | ICD-10-CM | POA: Diagnosis not present

## 2018-08-24 DIAGNOSIS — N529 Male erectile dysfunction, unspecified: Secondary | ICD-10-CM | POA: Diagnosis not present

## 2018-08-24 DIAGNOSIS — F39 Unspecified mood [affective] disorder: Secondary | ICD-10-CM | POA: Diagnosis not present

## 2018-08-24 DIAGNOSIS — F429 Obsessive-compulsive disorder, unspecified: Secondary | ICD-10-CM | POA: Diagnosis not present

## 2018-09-14 DIAGNOSIS — I10 Essential (primary) hypertension: Secondary | ICD-10-CM | POA: Diagnosis not present

## 2018-09-14 DIAGNOSIS — F17200 Nicotine dependence, unspecified, uncomplicated: Secondary | ICD-10-CM | POA: Diagnosis not present

## 2018-09-14 DIAGNOSIS — J449 Chronic obstructive pulmonary disease, unspecified: Secondary | ICD-10-CM | POA: Diagnosis not present

## 2018-09-14 DIAGNOSIS — L84 Corns and callosities: Secondary | ICD-10-CM | POA: Diagnosis not present

## 2018-10-31 ENCOUNTER — Other Ambulatory Visit (HOSPITAL_COMMUNITY): Payer: Self-pay | Admitting: Pulmonary Disease

## 2018-10-31 DIAGNOSIS — R2231 Localized swelling, mass and lump, right upper limb: Secondary | ICD-10-CM | POA: Diagnosis not present

## 2018-10-31 DIAGNOSIS — K5090 Crohn's disease, unspecified, without complications: Secondary | ICD-10-CM | POA: Diagnosis not present

## 2018-10-31 DIAGNOSIS — I10 Essential (primary) hypertension: Secondary | ICD-10-CM | POA: Diagnosis not present

## 2018-10-31 DIAGNOSIS — J449 Chronic obstructive pulmonary disease, unspecified: Secondary | ICD-10-CM | POA: Diagnosis not present

## 2018-11-09 ENCOUNTER — Ambulatory Visit (HOSPITAL_COMMUNITY): Admission: RE | Admit: 2018-11-09 | Payer: Medicare Other | Source: Ambulatory Visit

## 2018-11-16 ENCOUNTER — Ambulatory Visit (HOSPITAL_COMMUNITY)
Admission: RE | Admit: 2018-11-16 | Discharge: 2018-11-16 | Disposition: A | Discharge status: Home / Self Care | Payer: Medicare Other | Source: Ambulatory Visit | Attending: Pulmonary Disease | Admitting: Pulmonary Disease

## 2018-11-16 DIAGNOSIS — M1811 Unilateral primary osteoarthritis of first carpometacarpal joint, right hand: Secondary | ICD-10-CM | POA: Diagnosis not present

## 2018-11-16 DIAGNOSIS — R2231 Localized swelling, mass and lump, right upper limb: Secondary | ICD-10-CM | POA: Diagnosis not present

## 2019-02-19 DIAGNOSIS — X32XXXD Exposure to sunlight, subsequent encounter: Secondary | ICD-10-CM | POA: Diagnosis not present

## 2019-02-19 DIAGNOSIS — Z85828 Personal history of other malignant neoplasm of skin: Secondary | ICD-10-CM | POA: Diagnosis not present

## 2019-02-19 DIAGNOSIS — B078 Other viral warts: Secondary | ICD-10-CM | POA: Diagnosis not present

## 2019-02-19 DIAGNOSIS — L570 Actinic keratosis: Secondary | ICD-10-CM | POA: Diagnosis not present

## 2019-02-19 DIAGNOSIS — D225 Melanocytic nevi of trunk: Secondary | ICD-10-CM | POA: Diagnosis not present

## 2019-02-19 DIAGNOSIS — Z08 Encounter for follow-up examination after completed treatment for malignant neoplasm: Secondary | ICD-10-CM | POA: Diagnosis not present

## 2019-02-19 DIAGNOSIS — D485 Neoplasm of uncertain behavior of skin: Secondary | ICD-10-CM | POA: Diagnosis not present

## 2019-03-22 DIAGNOSIS — J449 Chronic obstructive pulmonary disease, unspecified: Secondary | ICD-10-CM | POA: Diagnosis not present

## 2019-03-22 DIAGNOSIS — N529 Male erectile dysfunction, unspecified: Secondary | ICD-10-CM | POA: Diagnosis not present

## 2019-03-22 DIAGNOSIS — F17200 Nicotine dependence, unspecified, uncomplicated: Secondary | ICD-10-CM | POA: Diagnosis not present

## 2019-03-22 DIAGNOSIS — I10 Essential (primary) hypertension: Secondary | ICD-10-CM | POA: Diagnosis not present

## 2019-03-29 DIAGNOSIS — F39 Unspecified mood [affective] disorder: Secondary | ICD-10-CM | POA: Diagnosis not present

## 2019-03-29 DIAGNOSIS — F429 Obsessive-compulsive disorder, unspecified: Secondary | ICD-10-CM | POA: Diagnosis not present

## 2019-03-29 DIAGNOSIS — F330 Major depressive disorder, recurrent, mild: Secondary | ICD-10-CM | POA: Diagnosis not present

## 2019-03-29 DIAGNOSIS — N529 Male erectile dysfunction, unspecified: Secondary | ICD-10-CM | POA: Diagnosis not present

## 2019-05-03 DIAGNOSIS — F39 Unspecified mood [affective] disorder: Secondary | ICD-10-CM | POA: Diagnosis not present

## 2019-05-03 DIAGNOSIS — N529 Male erectile dysfunction, unspecified: Secondary | ICD-10-CM | POA: Diagnosis not present

## 2019-05-03 DIAGNOSIS — F429 Obsessive-compulsive disorder, unspecified: Secondary | ICD-10-CM | POA: Diagnosis not present

## 2019-05-03 DIAGNOSIS — F330 Major depressive disorder, recurrent, mild: Secondary | ICD-10-CM | POA: Diagnosis not present

## 2019-06-28 DIAGNOSIS — N529 Male erectile dysfunction, unspecified: Secondary | ICD-10-CM | POA: Diagnosis not present

## 2019-06-28 DIAGNOSIS — F39 Unspecified mood [affective] disorder: Secondary | ICD-10-CM | POA: Diagnosis not present

## 2019-06-28 DIAGNOSIS — F429 Obsessive-compulsive disorder, unspecified: Secondary | ICD-10-CM | POA: Diagnosis not present

## 2019-06-28 DIAGNOSIS — F330 Major depressive disorder, recurrent, mild: Secondary | ICD-10-CM | POA: Diagnosis not present

## 2019-09-20 DIAGNOSIS — F429 Obsessive-compulsive disorder, unspecified: Secondary | ICD-10-CM | POA: Diagnosis not present

## 2019-09-20 DIAGNOSIS — F39 Unspecified mood [affective] disorder: Secondary | ICD-10-CM | POA: Diagnosis not present

## 2019-09-20 DIAGNOSIS — N529 Male erectile dysfunction, unspecified: Secondary | ICD-10-CM | POA: Diagnosis not present

## 2019-09-20 DIAGNOSIS — F330 Major depressive disorder, recurrent, mild: Secondary | ICD-10-CM | POA: Diagnosis not present

## 2019-09-25 ENCOUNTER — Other Ambulatory Visit: Payer: Self-pay

## 2019-09-25 ENCOUNTER — Ambulatory Visit: Payer: Medicare Other | Attending: Internal Medicine

## 2019-09-25 DIAGNOSIS — Z20822 Contact with and (suspected) exposure to covid-19: Secondary | ICD-10-CM

## 2019-09-26 ENCOUNTER — Other Ambulatory Visit: Payer: Medicare Other

## 2019-09-26 LAB — NOVEL CORONAVIRUS, NAA: SARS-CoV-2, NAA: NOT DETECTED

## 2019-10-27 DIAGNOSIS — Z23 Encounter for immunization: Secondary | ICD-10-CM | POA: Diagnosis not present

## 2019-12-19 ENCOUNTER — Encounter: Payer: Self-pay | Admitting: Nurse Practitioner

## 2019-12-19 ENCOUNTER — Other Ambulatory Visit: Payer: Self-pay

## 2019-12-19 ENCOUNTER — Ambulatory Visit (INDEPENDENT_AMBULATORY_CARE_PROVIDER_SITE_OTHER): Payer: Medicare Other | Admitting: Nurse Practitioner

## 2019-12-19 ENCOUNTER — Ambulatory Visit: Payer: TRICARE For Life (TFL) | Admitting: Nurse Practitioner

## 2019-12-19 VITALS — BP 102/70 | HR 68 | Temp 97.7°F | Resp 18 | Ht 68.0 in | Wt 149.0 lb

## 2019-12-19 DIAGNOSIS — Z125 Encounter for screening for malignant neoplasm of prostate: Secondary | ICD-10-CM

## 2019-12-19 DIAGNOSIS — I10 Essential (primary) hypertension: Secondary | ICD-10-CM | POA: Insufficient documentation

## 2019-12-19 DIAGNOSIS — F329 Major depressive disorder, single episode, unspecified: Secondary | ICD-10-CM

## 2019-12-19 DIAGNOSIS — D509 Iron deficiency anemia, unspecified: Secondary | ICD-10-CM

## 2019-12-19 DIAGNOSIS — Z13220 Encounter for screening for lipoid disorders: Secondary | ICD-10-CM

## 2019-12-19 DIAGNOSIS — Z7689 Persons encountering health services in other specified circumstances: Secondary | ICD-10-CM

## 2019-12-19 DIAGNOSIS — N429 Disorder of prostate, unspecified: Secondary | ICD-10-CM | POA: Diagnosis not present

## 2019-12-19 DIAGNOSIS — S0300XA Dislocation of jaw, unspecified side, initial encounter: Secondary | ICD-10-CM

## 2019-12-19 DIAGNOSIS — F17200 Nicotine dependence, unspecified, uncomplicated: Secondary | ICD-10-CM | POA: Diagnosis not present

## 2019-12-19 DIAGNOSIS — Z1159 Encounter for screening for other viral diseases: Secondary | ICD-10-CM | POA: Diagnosis not present

## 2019-12-19 DIAGNOSIS — Z122 Encounter for screening for malignant neoplasm of respiratory organs: Secondary | ICD-10-CM

## 2019-12-19 DIAGNOSIS — J302 Other seasonal allergic rhinitis: Secondary | ICD-10-CM | POA: Diagnosis not present

## 2019-12-19 DIAGNOSIS — N529 Male erectile dysfunction, unspecified: Secondary | ICD-10-CM | POA: Insufficient documentation

## 2019-12-19 DIAGNOSIS — Z23 Encounter for immunization: Secondary | ICD-10-CM

## 2019-12-19 DIAGNOSIS — R1011 Right upper quadrant pain: Secondary | ICD-10-CM | POA: Diagnosis not present

## 2019-12-19 NOTE — Progress Notes (Signed)
New Patient Office Visit  Subjective:  Patient ID: Joshua Fuller, male    DOB: 04-12-1946  Age: 74 y.o. MRN: [id number]  CC:  Chief Complaint  Patient presents with  . Establish Care    NP    HPI Joshua Fuller is a 74 year old male presenting for general well examination. Time spent discussing diet and exercise for healthy lifestyle, screenings r/t risk factors. The pt declined shigles and Tdap but accepts Prevnar 13 today. 10 minute without reaction. Minicog, depression, tobacco (etoh/drug) screening completed. Pt does report nightly use of cannabis and smokes approximately 4-5 times per month for the past few years but did smoke pack to two a day for >20 years. He desires annual CT screening. Csope not due again until 2024. Flu vaccine completed 04/2019. No cp/ct, gu/gi sxs, pain, sob, edema, or falls in past year.   Pt reports sxs of tightness with mild discomfort when he opens mouth on the left side. The sxs started 2 days ago and he has tried no treatments  Pt reports nasal congestion with nighttime clear drainage for past 3 days with blowing about 5 times prior to bed. No other sxs such as fever, chills, general body aches, sinus pressure, no txs tried.  Past Medical History:  Diagnosis Date  . Chronic mental illness    OCD  . History of MRSA infection    of the skin  . IDA (iron deficiency anemia)     Past Surgical History:  Procedure Laterality Date  . ANKLE SURGERY    . Capsule endoscopy  02/19/2007   [id number] view of gastric mucosa/Normal small bowel mucosa  . COLONOSCOPY  02/02/2007   SLF:Two 4-mm to 6-mm rectal polyps/sigmoid colon diverticulosis and internal hemorrhoids. hyperplastic polyps.   . COLONOSCOPY N/A 03/22/2013   SLF: ANEMIA MOST LIKELY DUE TO ULCERS IN terminal ileum/Moderate diverticulosis  in the sigmoid colon/ONE RECTAL polyp removed(hyperplastic). ileum bx with focal active ileitis. next TCS 03/2023 with overtube  . COLONOSCOPY,  ESOPHAGOGASTRODUODENOSCOPY (EGD) AND ESOPHAGEAL DILATION N/A 03/22/2013   [id number] DUE Stricture at the gastroesophageal junction/Large hiatal hernia/MILD Non-erosive gastritis & DUODENITIS (+h.pylori)  . ESOPHAGOGASTRODUODENOSCOPY   02/02/2007   [id number] hiatal hernia closely examined and no evidence of Cameron ulcers.  Otherwise, normal esophagus. Small bowel bx negative for celiac  . TONSILLECTOMY      Family History  Problem Relation Age of Onset  . Colon cancer Neg Hx     Social History   Socioeconomic History  . Marital status: Married    Spouse name: Not on file  . Number of children: 4  . Years of education: Not on file  . Highest education level: Not on file  Occupational History  . 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 Smoking status: Former Smoker    Types: Cigarettes    Quit date: 07/14/2019    Years since quitting: 0.4  . Smokeless tobacco: Never Used  . Tobacco comment: Smokes 7-10 cigarettes daily   Substance and Sexual Activity  . Alcohol use: Yes    Comment: Couple glasses of wine per week  . Drug use: No  . Sexual activity: Not on file  Other Topics Concern  . Not on file  Social History Narrative   Retired from [employer].    Social Determinants of Health   Financial Resource Strain:   . Difficulty of Paying Living Expenses:   Food Insecurity:   . Worried About  Running Out of Food in the Last Year:   . [place] in the Last Year:   Transportation Needs:   . Lack of Transportation (Medical):   Marland Kitchen Lack of Transportation (Non-Medical):   Physical Activity:   . Days of Exercise per Week:   . Minutes of Exercise per Session:   Stress:   . Feeling of Stress :   Social Connections:   . Frequency of Communication with Friends and Family:   . Frequency of Social Gatherings with Friends and Family:   . Attends Religious Services:   . Active Member of Clubs or Organizations:   . Attends Archivist  Meetings:   Marland Kitchen Marital Status:   Intimate Partner Violence:   . Fear of Current or Ex-Partner:   . Emotionally Abused:   Marland Kitchen Physically Abused:   . Sexually Abused:     ROS Review of Systems  All other systems reviewed and are negative.   Objective:   Today's Vitals: BP 102/70 (BP Location: Left Arm, Patient Position: Sitting, Cuff Size: Normal)   Pulse 68   Temp 97.7 F (36.5 C) (Temporal)   Resp 18   Ht 5\' 8"  (1.727 m)   Wt 149 lb (67.6 kg)   SpO2 97%   BMI 22.66 kg/m   Physical Exam Vitals and nursing note reviewed.  Constitutional:      Appearance: Normal appearance. He is well-developed and well-groomed.  HENT:     Head: Normocephalic.     Right Ear: Hearing, tympanic membrane, ear canal and external ear normal.     Left Ear: Hearing, tympanic membrane, ear canal and external ear normal.     Nose: Rhinorrhea present. Rhinorrhea is clear.     Right Sinus: No maxillary sinus tenderness or frontal sinus tenderness.     Left Sinus: No maxillary sinus tenderness or frontal sinus tenderness.     Mouth/Throat:     Lips: Pink.     Mouth: Mucous membranes are moist.     Dentition: Dental caries present.     Pharynx: Oropharynx is clear.  Eyes:     General: Lids are normal. Lids are everted, no foreign bodies appreciated.     Extraocular Movements: Extraocular movements intact.     Conjunctiva/sclera: Conjunctivae normal.     Pupils: Pupils are equal, round, and reactive to light.  Neck:     Thyroid: No thyromegaly or thyroid tenderness.     Vascular: No carotid bruit or JVD.  Cardiovascular:     Rate and Rhythm: Normal rate and regular rhythm.     Pulses: Normal pulses.     Heart sounds: Normal heart sounds, S1 normal and S2 normal.  Pulmonary:     Effort: Pulmonary effort is normal.     Breath sounds: Normal breath sounds.  Chest:     Chest wall: No deformity.  Abdominal:     General: Abdomen is flat. Bowel sounds are normal. There is no abdominal bruit.      Palpations: Abdomen is soft. There is no hepatomegaly or splenomegaly.     Tenderness: There is no abdominal tenderness.  Musculoskeletal:        General: Normal range of motion.     Cervical back: Normal range of motion and neck supple.     Right lower leg: No edema.     Left lower leg: No edema.  Lymphadenopathy:     Head:     Right side of head: No submental, submandibular, tonsillar,  preauricular, posterior auricular or occipital adenopathy.     Left side of head: No submental, submandibular, tonsillar, preauricular, posterior auricular or occipital adenopathy.     Cervical: No cervical adenopathy.     Upper Body:     Right upper body: No supraclavicular adenopathy.     Left upper body: No supraclavicular adenopathy.  Skin:    General: Skin is warm and dry.     Capillary Refill: Capillary refill takes less than 2 seconds.  Neurological:     General: No focal deficit present.     Mental Status: He is alert and oriented to person, place, and time.  Psychiatric:        Attention and Perception: Attention and perception normal.        Mood and Affect: Mood normal.        Speech: Speech normal.        Behavior: Behavior normal. Behavior is cooperative.        Cognition and Memory: Cognition and memory normal.        Judgment: Judgment normal.     Assessment & Plan:  Annual general well examination.   Time spent discussing diet and exercise for healthy lifestyle, screenings r/t risk factors. The pt declined shigles and Tdap but accepts Prevnar 13 today. 10 minute without reaction.   Minicog, depression, tobacco (etoh/drug) screening completed. Pt does report nightly use of cannabis and smokes approximately 4-5 times per month for the past few years but did smoke pack to two a day for >20 years. He desires annual CT screening.   Csope not due again until 2024. Flu vaccine completed 04/2019.    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  Discussed and educational print out provided  Seasonal Rhinitis: may take otc nasal spray such as Flonase daily as directed. May take otc claritin or zrytec. Discussed and educational print out provided.   Problem List Items Addressed This Visit      Cardiovascular and Mediastinum   Hypertension - Primary   Relevant Orders   COMPLETE METABOLIC PANEL WITH GFR   CBC with Differential/Platelet   Lipid panel     Other   IDA (iron deficiency anemia)   Relevant Orders   CBC with Differential/Platelet    Other Visit Diagnoses    Screening, lipid       Relevant Orders   Lipid panel   Encounter to establish care       Relevant Orders   COMPLETE METABOLIC PANEL WITH GFR   CBC with Differential/Platelet   Lipid panel   PSA   Prostate cancer screening       Relevant Orders   PSA   Need for hepatitis C screening test       Relevant Orders   Hepatitis panel, acute   Immunization due       Relevant Orders   Pneumococcal conjugate vaccine 13-valent IM (Completed)   Major depressive disorder with current active episode, unspecified depression episode severity, unspecified whether recurrent       Disorder of prostate, unspecified        Relevant Orders   PSA   Smoker       Relevant Orders   CT CHEST LUNG CA SCREEN LOW DOSE W/O CM   Encounter for screening for lung cancer       Relevant Orders   CT CHEST LUNG CA SCREEN LOW DOSE W/O CM   Dislocation of temporomandibular joint, initial  encounter       Seasonal allergic rhinitis, unspecified trigger          Outpatient Encounter Medications as of 12/19/2019  Medication Sig  . ferrous sulfate 325 (65 FE) MG EC tablet Take 325 mg by mouth daily with breakfast.  . lamoTRIgine (LAMICTAL) 100 MG tablet Take 100 mg by mouth daily.   Marland Kitchen lisinopril (PRINIVIL,ZESTRIL) 10 MG tablet Take 10 mg by mouth daily.  . Multiple Vitamin (MULTIVITAMIN) capsule Take 1 capsule by mouth daily.  . tadalafil (CIALIS) 5 MG tablet Take 5 mg by mouth daily.  .  Vilazodone HCl (VIIBRYD) 40 MG TABS Take 20 mg by mouth daily.    No facility-administered encounter medications on file as of 12/19/2019.    Follow-up: Return in about 6 months (around 06/19/2020) for labs one week prior.   Annie Main, FNP

## 2019-12-20 DIAGNOSIS — F429 Obsessive-compulsive disorder, unspecified: Secondary | ICD-10-CM | POA: Diagnosis not present

## 2019-12-20 DIAGNOSIS — F330 Major depressive disorder, recurrent, mild: Secondary | ICD-10-CM | POA: Diagnosis not present

## 2019-12-20 DIAGNOSIS — N529 Male erectile dysfunction, unspecified: Secondary | ICD-10-CM | POA: Diagnosis not present

## 2019-12-20 DIAGNOSIS — F39 Unspecified mood [affective] disorder: Secondary | ICD-10-CM | POA: Diagnosis not present

## 2019-12-20 LAB — CBC WITH DIFFERENTIAL/PLATELET
Absolute Monocytes: 637 cells/uL (ref 200–950)
Basophils Absolute: 87 cells/uL (ref 0–200)
Basophils Relative: 1.3 %
Eosinophils Absolute: 168 cells/uL (ref 15–500)
Eosinophils Relative: 2.5 %
HCT: 37.7 % — ABNORMAL LOW (ref 38.5–50.0)
Hemoglobin: 12.8 g/dL — ABNORMAL LOW (ref 13.2–17.1)
Lymphs Abs: 1990 cells/uL (ref 850–3900)
MCH: 32.7 pg (ref 27.0–33.0)
MCHC: 34 g/dL (ref 32.0–36.0)
MCV: 96.4 fL (ref 80.0–100.0)
MPV: 12.3 fL (ref 7.5–12.5)
Monocytes Relative: 9.5 %
Neutro Abs: 3819 cells/uL (ref 1500–7800)
Neutrophils Relative %: 57 %
Platelets: 206 10*3/uL (ref 140–400)
RBC: 3.91 10*6/uL — ABNORMAL LOW (ref 4.20–5.80)
RDW: 13.6 % (ref 11.0–15.0)
Total Lymphocyte: 29.7 %
WBC: 6.7 10*3/uL (ref 3.8–10.8)

## 2019-12-20 LAB — COMPLETE METABOLIC PANEL WITH GFR
AG Ratio: 2 (calc) (ref 1.0–2.5)
ALT: 17 U/L (ref 9–46)
AST: 20 U/L (ref 10–35)
Albumin: 4.2 g/dL (ref 3.6–5.1)
Alkaline phosphatase (APISO): 74 U/L (ref 35–144)
BUN: 25 mg/dL (ref 7–25)
CO2: 26 mmol/L (ref 20–32)
Calcium: 8.7 mg/dL (ref 8.6–10.3)
Chloride: 106 mmol/L (ref 98–110)
Creat: 1.1 mg/dL (ref 0.70–1.18)
GFR, Est African American: 77 mL/min/{1.73_m2} (ref >=60)
GFR, Est Non African American: 66 mL/min/{1.73_m2} (ref >=60)
Globulin: 2.1 g/dL (calc) (ref 1.9–3.7)
Glucose, Bld: 83 mg/dL (ref 65–99)
Potassium: 4.3 mmol/L (ref 3.5–5.3)
Sodium: 141 mmol/L (ref 135–146)
Total Bilirubin: 0.4 mg/dL (ref 0.2–1.2)
Total Protein: 6.3 g/dL (ref 6.1–8.1)

## 2019-12-20 LAB — HEPATITIS PANEL, ACUTE
Hep A IgM: NONREACTIVE
Hep B C IgM: NONREACTIVE
Hepatitis B Surface Ag: NONREACTIVE
Hepatitis C Ab: NONREACTIVE
SIGNAL TO CUT-OFF: 0.01 (ref <1.00)

## 2019-12-20 LAB — LIPID PANEL
Cholesterol: 165 mg/dL (ref <200)
HDL: 49 mg/dL (ref >=40)
LDL Cholesterol (Calc): 97 mg/dL (calc)
Non-HDL Cholesterol (Calc): 116 mg/dL (calc) (ref <130)
Total CHOL/HDL Ratio: 3.4 (calc) (ref <5.0)
Triglycerides: 94 mg/dL (ref <150)

## 2019-12-20 LAB — PSA: PSA: 2.8 ng/mL (ref <=4.0)

## 2019-12-24 ENCOUNTER — Encounter: Payer: Self-pay | Admitting: Nurse Practitioner

## 2019-12-24 NOTE — Progress Notes (Signed)
Labs food, h/h improved from last. Hep negative. Follow up as planned

## 2020-01-08 ENCOUNTER — Ambulatory Visit (HOSPITAL_COMMUNITY): Payer: Medicare Other

## 2020-01-23 ENCOUNTER — Other Ambulatory Visit: Payer: Self-pay

## 2020-01-23 ENCOUNTER — Ambulatory Visit (HOSPITAL_COMMUNITY)
Admission: RE | Admit: 2020-01-23 | Discharge: 2020-01-23 | Disposition: A | Discharge status: Home / Self Care | Payer: Medicare Other | Source: Ambulatory Visit | Attending: Nurse Practitioner | Admitting: Nurse Practitioner

## 2020-01-23 DIAGNOSIS — F17200 Nicotine dependence, unspecified, uncomplicated: Secondary | ICD-10-CM

## 2020-01-23 DIAGNOSIS — Z122 Encounter for screening for malignant neoplasm of respiratory organs: Secondary | ICD-10-CM

## 2020-02-11 HISTORY — PX: DENTAL SURGERY: SHX609

## 2020-02-29 ENCOUNTER — Encounter: Payer: Self-pay | Admitting: Nurse Practitioner

## 2020-03-02 ENCOUNTER — Other Ambulatory Visit: Payer: Self-pay | Admitting: Nurse Practitioner

## 2020-03-02 DIAGNOSIS — R194 Change in bowel habit: Secondary | ICD-10-CM

## 2020-03-03 ENCOUNTER — Telehealth: Payer: Self-pay | Admitting: Nurse Practitioner

## 2020-03-03 NOTE — Telephone Encounter (Signed)
CB# [phone] Pt call about his referral to a  Gastroenterologist need to speak the nurse

## 2020-03-03 NOTE — Telephone Encounter (Signed)
Left voicemail for Pt to call office back

## 2020-03-04 NOTE — Telephone Encounter (Signed)
Pt had question about referral he does have one over at GI in [place] with  Dr. Laural Golden .

## 2020-03-05 ENCOUNTER — Other Ambulatory Visit: Payer: Self-pay

## 2020-03-05 ENCOUNTER — Ambulatory Visit (INDEPENDENT_AMBULATORY_CARE_PROVIDER_SITE_OTHER): Payer: Medicare Other | Admitting: Nurse Practitioner

## 2020-03-05 VITALS — BP 102/66 | HR 48 | Temp 98.1°F | Resp 18 | Wt 146.6 lb

## 2020-03-05 DIAGNOSIS — R197 Diarrhea, unspecified: Secondary | ICD-10-CM

## 2020-03-05 DIAGNOSIS — K5792 Diverticulitis of intestine, part unspecified, without perforation or abscess without bleeding: Secondary | ICD-10-CM

## 2020-03-05 MED ORDER — CIPROFLOXACIN HCL 500 MG PO TABS: 500.0000 mg | ORAL_TABLET | Freq: Two times a day (BID) | ORAL | 0 refills | Status: DC

## 2020-03-05 MED ORDER — CIPROFLOXACIN HCL 500 MG PO TABS: 500.0000 mg | ORAL_TABLET | Freq: Two times a day (BID) | ORAL | 0 refills | Status: AC

## 2020-03-05 MED ORDER — METRONIDAZOLE 500 MG PO TABS: 500.0000 mg | ORAL_TABLET | Freq: Three times a day (TID) | ORAL | 0 refills | Status: AC

## 2020-03-05 MED ORDER — METRONIDAZOLE 500 MG PO TABS: 500.0000 mg | ORAL_TABLET | Freq: Three times a day (TID) | ORAL | 0 refills | Status: DC

## 2020-03-05 NOTE — Progress Notes (Signed)
New Patient Office Visit  Subjective:  Patient ID: Joshua Fuller, male    DOB: [birth year]  Age: 74 y.o. MRN: [id number]  CC:  Chief Complaint  Patient presents with  . Diarrhea    pt states alot, started 06/12, no meds    HPI Joshua Fuller  Is a 74 year old male presenting to clinic with c/o diarrhea for 1 week and left lower quad abdominal discomfort. Per scope report 8 years ago pt has diverticulosis. He has not had episode of diverticulitis that he recalls. He has tried no treatment for his sxs. He denied fever or chills.   No cp, ct, gu/gi sxs, sob, pain, edema, injury or falls.   Past Medical History:  Diagnosis Date  . Chronic mental illness    OCD  . History of MRSA infection    of the skin  . IDA (iron deficiency anemia)     Past Surgical History:  Procedure Laterality Date  . ANKLE SURGERY    . Capsule endoscopy  02/19/2007   RAQ:TMAUQJF view of gastric mucosa/Normal small bowel mucosa  . COLONOSCOPY  02/02/2007   SLF:Two 4-mm to 6-mm rectal polyps/sigmoid colon diverticulosis and internal hemorrhoids. hyperplastic polyps.   . COLONOSCOPY N/A 03/22/2013   SLF: ANEMIA MOST LIKELY DUE TO ULCERS IN terminal ileum/Moderate diverticulosis  in the sigmoid colon/ONE RECTAL polyp removed(hyperplastic). ileum bx with focal active ileitis. next TCS 03/2023 with overtube  . COLONOSCOPY, ESOPHAGOGASTRODUODENOSCOPY (EGD) AND ESOPHAGEAL DILATION N/A 03/22/2013   HLK:TGYBWLSLH DUE Stricture at the gastroesophageal junction/Large hiatal hernia/MILD Non-erosive gastritis & DUODENITIS (+h.pylori)  . ESOPHAGOGASTRODUODENOSCOPY   02/02/2007   TDS:KAJGO hiatal hernia closely examined and no evidence of Cameron ulcers.  Otherwise, normal esophagus. Small bowel bx negative for celiac  . TONSILLECTOMY      Family History  Problem Relation Age of Onset  . Colon cancer Neg Hx     Social History   Socioeconomic History  . Marital status: Married    Spouse name: Not on file    . Number of children: 4  . Years of education: Not on file  . Highest education level: Not on file  Occupational History  . 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 Smoking status: Former Smoker    Types: Cigarettes    Quit date: 07/14/2019    Years since quitting: 0.6  . Smokeless tobacco: Never Used  . Tobacco comment: Smokes 7-10 cigarettes daily   Substance and Sexual Activity  . Alcohol use: Yes    Comment: Couple glasses of wine per week  . Drug use: No  . Sexual activity: Not on file  Other Topics Concern  . Not on file  Social History Narrative   Retired from [employer].    Social Determinants of Health   Financial Resource Strain:   . Difficulty of Paying Living Expenses:   Food Insecurity:   . Worried About Charity fundraiser in the Last Year:   . Arboriculturist in the Last Year:   Transportation Needs:   . Film/video editor (Medical):   Marland Kitchen Lack of Transportation (Non-Medical):   Physical Activity:   . Days of Exercise per Week:   . Minutes of Exercise per Session:   Stress:   . Feeling of Stress :   Social Connections:   . Frequency of Communication with Friends and Family:   . Frequency of Social Gatherings with Friends and Family:   .  Attends Religious Services:   . Active Member of Clubs or Organizations:   . Attends Archivist Meetings:   Marland Kitchen Marital Status:   Intimate Partner Violence:   . Fear of Current or Ex-Partner:   . Emotionally Abused:   Marland Kitchen Physically Abused:   . Sexually Abused:     ROS Review of Systems  All other systems reviewed and are negative.   Objective:   Today's Vitals: BP 102/66 (BP Location: Left Arm, Patient Position: Sitting, Cuff Size: Normal)   Pulse (!) 48   Temp 98.1 F (36.7 C) (Temporal)   Resp 18   Wt 146 lb 9.6 oz (66.5 kg)   SpO2 98%   BMI 22.29 kg/m   Physical Exam Constitutional:      General: He is not in acute distress.    Appearance: Normal appearance. He  is not ill-appearing, toxic-appearing or diaphoretic.  HENT:     Head: Normocephalic.     Nose: Nose normal.     Mouth/Throat:     Mouth: Mucous membranes are moist.     Pharynx: Oropharynx is clear.  Eyes:     Conjunctiva/sclera: Conjunctivae normal.     Pupils: Pupils are equal, round, and reactive to light.  Neck:     Vascular: No carotid bruit.  Cardiovascular:     Rate and Rhythm: Normal rate.  Pulmonary:     Effort: Pulmonary effort is normal.  Abdominal:     General: Abdomen is flat. Bowel sounds are normal. There is no distension.     Palpations: Abdomen is soft. There is no shifting dullness, fluid wave, hepatomegaly, splenomegaly, mass or pulsatile mass.     Tenderness: There is abdominal tenderness in the left lower quadrant. There is no right CVA tenderness, left CVA tenderness, guarding or rebound. Negative signs include Murphy's sign, Rovsing's sign, McBurney's sign, psoas sign and obturator sign.     Hernia: No hernia is present. There is no hernia in the umbilical area, ventral area, left inguinal area, right femoral area, left femoral area or right inguinal area.  Musculoskeletal:        General: Normal range of motion.     Cervical back: Normal range of motion and neck supple.     Right lower leg: No edema.     Left lower leg: No edema.  Skin:    General: Skin is warm and dry.     Coloration: Skin is not jaundiced.     Findings: No bruising, erythema or rash.  Neurological:     General: No focal deficit present.     Mental Status: He is alert and oriented to person, place, and time.  Psychiatric:        Mood and Affect: Mood normal.        Behavior: Behavior normal.        Thought Content: Thought content normal.        Judgment: Judgment normal.     Assessment & Plan:   Problem List Items Addressed This Visit    None    Visit Diagnoses    Diverticulitis    -  Primary   Relevant Medications   ciprofloxacin (CIPRO) 500 MG tablet   metroNIDAZOLE  (FLAGYL) 500 MG tablet   Diarrhea, unspecified type        Your sxs are consistent with diverticulitis start and complete antibiotics sent to your pharmacy Follow up on Monday Seek urgent medical attention if you develop fever, chills, cannot hold food or  liquids, increase diarrhea or inability to have bowel movement.  Eat a liquid diet today advance as tolerated  Outpatient Encounter Medications as of 03/05/2020  Medication Sig  . ferrous sulfate 325 (65 FE) MG EC tablet Take 325 mg by mouth daily with breakfast.  . lamoTRIgine (LAMICTAL) 100 MG tablet Take 100 mg by mouth daily.   . Multiple Vitamin (MULTIVITAMIN) capsule Take 1 capsule by mouth daily.  . Vilazodone HCl (VIIBRYD) 40 MG TABS Take 20 mg by mouth daily.   . ciprofloxacin (CIPRO) 500 MG tablet Take 1 tablet (500 mg total) by mouth 2 (two) times daily for 7 days.  Marland Kitchen lisinopril (PRINIVIL,ZESTRIL) 10 MG tablet Take 10 mg by mouth daily. (Patient not taking: Reported on 03/05/2020)  . metroNIDAZOLE (FLAGYL) 500 MG tablet Take 1 tablet (500 mg total) by mouth 3 (three) times daily for 7 days.  . [DISCONTINUED] tadalafil (CIALIS) 5 MG tablet Take 5 mg by mouth daily.   No facility-administered encounter medications on file as of 03/05/2020.    Follow-up: Return in about 4 days (around 03/09/2020).   Annie Main, FNP

## 2020-03-05 NOTE — Patient Instructions (Signed)
Your sxs are consistent with diverticulitis start and complete antibiotics sent to your pharmacy Follow up on Monday Seek urgent medical attention if you develop fever, chills, cannot hold food or liquids, increase diarrhea or inability to have bowel movement.  Eat a liquid diet today advance as tolerated

## 2020-03-09 ENCOUNTER — Other Ambulatory Visit: Payer: Self-pay

## 2020-03-09 ENCOUNTER — Telehealth (INDEPENDENT_AMBULATORY_CARE_PROVIDER_SITE_OTHER): Payer: Medicare Other | Admitting: Nurse Practitioner

## 2020-03-09 DIAGNOSIS — K5792 Diverticulitis of intestine, part unspecified, without perforation or abscess without bleeding: Secondary | ICD-10-CM | POA: Diagnosis not present

## 2020-03-09 DIAGNOSIS — Z09 Encounter for follow-up examination after completed treatment for conditions other than malignant neoplasm: Secondary | ICD-10-CM | POA: Diagnosis not present

## 2020-03-09 NOTE — Progress Notes (Signed)
Virtual Visit via Telephone Note  I connected with Joshua Fuller on 03/09/20 at  8:00 AM EDT by a telephone enabled telemedicine application and verified that I am speaking with the correct person using two identifiers.   I discussed the limitations of evaluation and management by telemedicine and the availability of in person appointments. The patient expressed understanding and agreed to proceed.  History of Present Illness: Pt is a 74 year old male presenting for a follow up appointment after being seen in clinic and treated last week for diverticulitis. His sxs where abdominal discomfort mid to lower left abdomen and diarrhea. His treatment was liquid diet advancing as tolerated, hydration, and antibiotics with today's follow up. The pt could not make in clinic appointment therefore desired/agreed to video visit this AM. The pt was unable to get the video to open therefor the video visit was changed to telephonic visit per pt desire.   Today the pt reports that he has followed liquid diet and tolerated some solids as of Saturday without diarrhea or abdominal discomfort. He reports he did drink a Industrial/product designer and had one episode of diarrhea but no others since.   No fever, chills, nausea, vomiting, no melena or hematochezia. Tolerating PO's.   Past Medical History:  Diagnosis Date  . Anemia    Phreesia 03/09/2020  . Chronic mental illness    OCD  . Depression    Phreesia 03/09/2020  . History of MRSA infection    of the skin  . Hypertension    Phreesia 03/09/2020  . IDA (iron deficiency anemia)    Past Surgical History:  Procedure Laterality Date  . ANKLE SURGERY    . APPENDECTOMY N/A    Phreesia 03/09/2020  . Capsule endoscopy  02/19/2007   SJG:GEZMOQH view of gastric mucosa/Normal small bowel mucosa  . COLONOSCOPY  02/02/2007   SLF:Two 4-mm to 6-mm rectal polyps/sigmoid colon diverticulosis and internal hemorrhoids. hyperplastic polyps.   . COLONOSCOPY N/A 03/22/2013    SLF: ANEMIA MOST LIKELY DUE TO ULCERS IN terminal ileum/Moderate diverticulosis  in the sigmoid colon/ONE RECTAL polyp removed(hyperplastic). ileum bx with focal active ileitis. next TCS 03/2023 with overtube  . COLONOSCOPY, ESOPHAGOGASTRODUODENOSCOPY (EGD) AND ESOPHAGEAL DILATION N/A 03/22/2013   UTM:[id number] DUE Stricture at the gastroesophageal junction/Large hiatal hernia/MILD Non-erosive gastritis & DUODENITIS (+h.pylori)  . ESOPHAGOGASTRODUODENOSCOPY   02/02/2007   FKC:LEXNT hiatal hernia closely examined and no evidence of Cameron ulcers.  Otherwise, normal esophagus. Small bowel bx negative for celiac  . TONSILLECTOMY     Current Outpatient Medications on File Prior to Visit  Medication Sig Dispense Refill  . ciprofloxacin (CIPRO) 500 MG tablet Take 1 tablet (500 mg total) by mouth 2 (two) times daily for 7 days. 14 tablet 0  . ferrous sulfate 325 (65 FE) MG EC tablet Take 325 mg by mouth daily with breakfast.    . lamoTRIgine (LAMICTAL) 100 MG tablet Take 100 mg by mouth daily.     Marland Kitchen lisinopril (PRINIVIL,ZESTRIL) 10 MG tablet Take 10 mg by mouth daily. (Patient not taking: Reported on 03/05/2020)    . metroNIDAZOLE (FLAGYL) 500 MG tablet Take 1 tablet (500 mg total) by mouth 3 (three) times daily for 7 days. 21 tablet 0  . Multiple Vitamin (MULTIVITAMIN) capsule Take 1 capsule by mouth daily.    . Vilazodone HCl (VIIBRYD) 40 MG TABS Take 20 mg by mouth daily.      No current facility-administered medications on file prior to visit.   Allergies  Allergen Reactions  . Penicillins Rash    Has patient had a PCN reaction causing immediate rash, facial/tongue/throat swelling, SOB or lightheadedness with hypotension: yes Has patient had a PCN reaction causing severe rash involving mucus membranes or skin necrosis: no Has patient had a PCN reaction that required hospitalization: no Has patient had a PCN reaction occurring within the last 10 years: no If all of the above answers are "NO",  then may proceed with Cephalosporin use.    Social History   Socioeconomic History  . Marital status: Married    Spouse name: Not on file  . Number of children: 4  . Years of education: Not on file  . Highest education level: Not on file  Occupational History  . 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 Smoking status: Former Smoker    Types: Cigarettes    Quit date: 07/14/2019    Years since quitting: 0.6  . Smokeless tobacco: Never Used  . Tobacco comment: Smokes 7-10 cigarettes daily   Substance and Sexual Activity  . Alcohol use: Yes    Comment: Couple glasses of wine per week  . Drug use: No  . Sexual activity: Not on file  Other Topics Concern  . Not on file  Social History Narrative   Retired from [employer].    Social Determinants of Health   Financial Resource Strain:   . Difficulty of Paying Living Expenses:   Food Insecurity:   . Worried About Charity fundraiser in the Last Year:   . Arboriculturist in the Last Year:   Transportation Needs:   . Film/video editor (Medical):   Marland Kitchen Lack of Transportation (Non-Medical):   Physical Activity:   . Days of Exercise per Week:   . Minutes of Exercise per Session:   Stress:   . Feeling of Stress :   Social Connections:   . Frequency of Communication with Friends and Family:   . Frequency of Social Gatherings with Friends and Family:   . Attends Religious Services:   . Active Member of Clubs or Organizations:   . Attends Archivist Meetings:   Marland Kitchen Marital Status:   Intimate Partner Violence:   . Fear of Current or Ex-Partner:   . Emotionally Abused:   Marland Kitchen Physically Abused:   . Sexually Abused:    Breast Cancer-relatedfamily history is not on file. Immunization History  Administered Date(s) Administered  . Pneumococcal Conjugate-13 12/19/2019      Observations/Objective: A/ox3, able to speak full sentences.   Assessment and Plan: Diverticulitis  Follow-up exam  after treatment continued treatment plan  Follow up for return, non resolving, or worsening of symptoms.    Follow Up Instructions:    I discussed the assessment and treatment plan with the patient. The patient was provided an opportunity to ask questions and all were answered. The patient agreed with the plan and demonstrated an understanding of the instructions.   The patient was advised to call back or seek an in-person evaluation if the symptoms worsen or if the condition fails to improve as anticipated.  I provided 15 minutes of non-face-to-face time during this encounter. 08:15  Annie Main, FNP

## 2020-03-27 DIAGNOSIS — N529 Male erectile dysfunction, unspecified: Secondary | ICD-10-CM | POA: Diagnosis not present

## 2020-03-27 DIAGNOSIS — F429 Obsessive-compulsive disorder, unspecified: Secondary | ICD-10-CM | POA: Diagnosis not present

## 2020-03-27 DIAGNOSIS — F330 Major depressive disorder, recurrent, mild: Secondary | ICD-10-CM | POA: Diagnosis not present

## 2020-03-27 DIAGNOSIS — F39 Unspecified mood [affective] disorder: Secondary | ICD-10-CM | POA: Diagnosis not present

## 2020-04-02 ENCOUNTER — Ambulatory Visit (INDEPENDENT_AMBULATORY_CARE_PROVIDER_SITE_OTHER): Payer: Medicare Other | Admitting: Gastroenterology

## 2020-04-02 ENCOUNTER — Encounter (INDEPENDENT_AMBULATORY_CARE_PROVIDER_SITE_OTHER): Payer: Self-pay | Admitting: Gastroenterology

## 2020-04-02 ENCOUNTER — Other Ambulatory Visit: Payer: Self-pay

## 2020-04-02 DIAGNOSIS — R195 Other fecal abnormalities: Secondary | ICD-10-CM | POA: Insufficient documentation

## 2020-04-02 NOTE — Progress Notes (Signed)
[name], M.D. Gastroenterology & Hepatology [facility] [address] [place], [place] [address] Primary Care Physician: Annie Main, [place] [id number]  Referring MD: Hayden Pedro, FNP  I will communicate my assessment and recommendations to the referring MD via EMR. Note: Occasional unusual wording and randomly placed punctuation marks may result from the use of speech recognition technology to transcribe this document"  Chief Complaint: Diarrhea  History of Present Illness: Joshua Fuller is a 74 y.o. male with PMH HTN, IDA on oral supplementation, depression and unclear history of Crohn's ileitis, who presents for evaluation of acute onset diarrhea.  Patient states that on 02/22/2020 he had new onset diarrhea when he was outside his home, which he described as a fecal soiling event that was "very embarrassing ".  The patient reports that he had multiple bowel movements that day which were watery in nature with mucus but without blood, close to "one bowel movement every 2 hours".  Also reported some diffuse abdominal cramping at that time.  He reports that he had the symptoms for the next 3 days but he decided to increase his marijuana cigarette consumption which led to an improvement in the frequency of his bowel movements.  He did not take any antidiarrheals or any other sort of medication.  Notably, given his symptoms, he was seen by his primary care on 03/05/2020.  It was considered that he had symptoms concerning for diverticulitis, for which she was prescribed ciprofloxacin and metronidazole which she took for 7 days compliantly.  He states that he did not feel any improvement with his antibiotics.  However he insisted that with the use of the marijuana the bowel movements normalized in quantity.  Most recently he has had 1-2 loose bowel movements. The patient denies having any nausea, vomiting,  fever, chills, hematochezia, melena, hematemesis, abdominal distention, jaundice, pruritus or weight loss.  Of note, the patient is a former patient of Dr. Oneida Alar.  He had a history of watery bowel movements in the past.  He had previous endoscopic investigations which were suggestive of terminal ileum Crohn's but pathology was not 100% conclusive of this.  It was felt that his symptoms could be related to NSAID use.  However, given positive IBD serology panel in the past discussion was held with the patient regarding the use of medications to control his Crohn's disease.  However, the patient declined to undergo any treatment as he was asymptomatic and actually remained asymptomatic until his most recent presentation this year.  I personally reviewed and interpreted the available lab results which showed: 12/19/2019 normal BMP, CBC with hemoglobin of 12.8 within normal limits, normal white blood cell count and platelets, albumin was 4.2.  I personally reviewed and interpreted the available imaging which showed: CT abdomen CT abdomen with IV contrast on 09/13/2013 showed presence of wall thickening in the terminal ileum measuring approximately 20 cm in length.  Repeat CT of the abdomen with IV contrast on 05/16/2014 showed resolution of the inflammation without any acute findings.  Last EGD: 2014, found to have a stricture at the GE junction, this was dilated with a Savary dilator up to 16 mm.  Stomach showed mild nonerosive gastropathy with multiple biopsies taken.  Duodenum showed duodenitis.  The patient was found to have H. pylori in stomach biopsies but no gastrointestinal metaplasia, dysplasia or malignancy. Received Pylera for this.  Last Colonoscopy: 2014-multiple ulcers were present in the terminal  ileum, the scope was advanced up to 20 cm from the valve, presence of moderate diverticulosis in the sigmoid colon.  There was a 4 mm polyp in the rectum.  Ileal pathology showed focal ileitis but no  dysplasia.  Rectal polyp was hyperplastic.  FHx: neg for any gastrointestinal/liver disease, no malignancies Social: quit smoking 6 months ago, regular alcohol or other illicit drug use Surgical: appendectomy  Past Medical History: Past Medical History:  Diagnosis Date  . Anemia    Phreesia 03/09/2020  . Chronic mental illness    OCD  . Depression    Phreesia 03/09/2020  . History of MRSA infection    of the skin  . Hypertension    Phreesia 03/09/2020  . IDA (iron deficiency anemia)     Past Surgical History: Past Surgical History:  Procedure Laterality Date  . ANKLE SURGERY    . APPENDECTOMY N/A    Phreesia 03/09/2020  . Capsule endoscopy  02/19/2007   PYK:DXIPJAS view of gastric mucosa/Normal small bowel mucosa  . COLONOSCOPY  02/02/2007   SLF:Two 4-mm to 6-mm rectal polyps/sigmoid colon diverticulosis and internal hemorrhoids. hyperplastic polyps.   . COLONOSCOPY N/A 03/22/2013   SLF: ANEMIA MOST LIKELY DUE TO ULCERS IN terminal ileum/Moderate diverticulosis  in the sigmoid colon/ONE RECTAL polyp removed(hyperplastic). ileum bx with focal active ileitis. next TCS 03/2023 with overtube  . COLONOSCOPY, ESOPHAGOGASTRODUODENOSCOPY (EGD) AND ESOPHAGEAL DILATION N/A 03/22/2013   NKN:LZJQBHALP DUE Stricture at the gastroesophageal junction/Large hiatal hernia/MILD Non-erosive gastritis & DUODENITIS (+h.pylori)  . DENTAL SURGERY  02/2020  . ESOPHAGOGASTRODUODENOSCOPY   02/02/2007   FXT:KWIOX hiatal hernia closely examined and no evidence of Cameron ulcers.  Otherwise, normal esophagus. Small bowel bx negative for celiac  . TONSILLECTOMY      Family History: Family History  Problem Relation Age of Onset  . Lung disease Mother   . Obesity Father   . Colon cancer Neg Hx     Social History: Social History   Tobacco Use  Smoking Status Former Smoker  . Types: Cigarettes  . Quit date: 07/14/2019  . Years since quitting: 0.7  Smokeless Tobacco Never Used  Tobacco Comment    Smokes 7-10 cigarettes daily    Social History   Substance and Sexual Activity  Alcohol Use Yes   Comment: Couple glasses of wine per month    Social History   Substance and Sexual Activity  Drug Use No    Allergies: Allergies  Allergen Reactions  . Penicillins Rash    Has patient had a PCN reaction causing immediate rash, facial/tongue/throat swelling, SOB or lightheadedness with hypotension: yes Has patient had a PCN reaction causing severe rash involving mucus membranes or skin necrosis: no Has patient had a PCN reaction that required hospitalization: no Has patient had a PCN reaction occurring within the last 10 years: no If all of the above answers are "NO", then may proceed with Cephalosporin use.     Medications: Current Outpatient Medications  Medication Sig Dispense Refill  . ferrous sulfate 325 (65 FE) MG EC tablet Take 325 mg by mouth daily with breakfast.    . lamoTRIgine (LAMICTAL) 100 MG tablet Take 100 mg by mouth daily.     Marland Kitchen lisinopril (PRINIVIL,ZESTRIL) 10 MG tablet Take 10 mg by mouth daily.     . Multiple Vitamin (MULTIVITAMIN) capsule Take 1 capsule by mouth daily.    . Vilazodone HCl (VIIBRYD) 40 MG TABS Take 20 mg by mouth daily.      No current  facility-administered medications for this visit.    Review of Systems: GENERAL: negative for malaise, night sweats HEENT: No changes in hearing or vision, no nose bleeds or other nasal problems. NECK: Negative for lumps, goiter, pain and significant neck swelling RESPIRATORY: Negative for cough, wheezing CARDIOVASCULAR: Negative for chest pain, leg swelling, palpitations, orthopnea GI: SEE HPI MUSCULOSKELETAL: Negative for joint pain or swelling, back pain, and muscle pain. SKIN: Negative for lesions, rash PSYCH: Negative for sleep disturbance, mood disorder and recent psychosocial stressors. HEMATOLOGY Negative for prolonged bleeding, bruising easily, and swollen nodes. ENDOCRINE: Negative for cold or  heat intolerance, polyuria, polydipsia and goiter. NEURO: negative for tremor, gait imbalance, syncope and seizures. The remainder of the review of systems is noncontributory.   Physical Exam: BP 121/75 (BP Location: Right Arm, Patient Position: Sitting, Cuff Size: Normal)   Pulse 66   Temp 98.6 F (37 C) (Oral)   Ht 5\' 8"  (1.727 m)   Wt 140 lb 6.4 oz (63.7 kg)   BMI 21.35 kg/m  GENERAL: The patient is AO x3, in no acute distress. HEENT: Head is normocephalic and atraumatic. EOMI are intact. Mouth is well hydrated and without lesions. NECK: Supple. No masses LUNGS: Clear to auscultation. No presence of rhonchi/wheezing/rales. Adequate chest expansion HEART: RRR, normal s1 and s2. ABDOMEN: Soft, nontender, no guarding, no peritoneal signs, and nondistended. BS +. No masses. EXTREMITIES: Without any cyanosis, clubbing, rash, lesions or edema. NEUROLOGIC: AOx3, no focal motor deficit. SKIN: no jaundice, no rashes   Imaging/Labs: as above  I personally reviewed and interpreted the available labs, imaging and endoscopic files.  Impression and Plan: DIMITRIOS BALESTRIERI is a 74 y.o. male with PMH HTN, IDA on oral supplementation, depression and unclear history of Crohn's ileitis, who presents for evaluation of acute onset diarrhea.  The patient had acute onset of diarrhea that lasted for a few days but has resolved since then.  Based on timing of his symptoms, this is most consistent with viral gastroenteritis episode that was self-limited.  Since he has been asymptomatic since then and has normal blood work-up recently with normal surveillance imaging, I do not consider any repeat colonoscopies are warranted at this point.  Furthermore, his diagnosis of Crohn's disease is questionable as he has remained asymptomatic without treatment for many years which is atypical for Crohn's disease.  Ulcerations in the terminal ileum could be related to multiple etiologies and not only due to Crohn's.  I  advised the patient to increase his intake of Benefiber to increase the bulk of his stool.  He will need to have a screening colonoscopy in three more years as his most recent one did not show any polyps.  -Start Benefiber fiber supplements daily to increase stool bulk -Call to office if symptoms recur -Repeat screening colonoscopy in 3 years  All questions were answered.      Joshua Quale, MD Gastroenterology and Hepatology [facility]

## 2020-04-02 NOTE — Patient Instructions (Signed)
Start Benefiber fiber supplements daily to increase stool bulk Call to office if symptoms recur Repeat screening colonoscopy in 3 years

## 2020-06-04 ENCOUNTER — Other Ambulatory Visit: Payer: Medicare Other

## 2020-06-04 DIAGNOSIS — Z20822 Contact with and (suspected) exposure to covid-19: Secondary | ICD-10-CM | POA: Diagnosis not present

## 2020-06-06 LAB — NOVEL CORONAVIRUS, NAA: SARS-CoV-2, NAA: NOT DETECTED

## 2020-06-06 LAB — SARS-COV-2, NAA 2 DAY TAT

## 2020-06-22 ENCOUNTER — Ambulatory Visit: Payer: Medicare Other | Admitting: Nurse Practitioner

## 2020-07-10 ENCOUNTER — Other Ambulatory Visit: Payer: Self-pay

## 2020-07-10 ENCOUNTER — Ambulatory Visit (INDEPENDENT_AMBULATORY_CARE_PROVIDER_SITE_OTHER): Payer: Medicare Other | Admitting: Family Medicine

## 2020-07-10 VITALS — BP 130/80 | HR 71 | Temp 97.9°F | Ht 68.0 in | Wt 138.4 lb

## 2020-07-10 DIAGNOSIS — D508 Other iron deficiency anemias: Secondary | ICD-10-CM | POA: Diagnosis not present

## 2020-07-10 DIAGNOSIS — I700 Atherosclerosis of aorta: Secondary | ICD-10-CM

## 2020-07-10 DIAGNOSIS — F3340 Major depressive disorder, recurrent, in remission, unspecified: Secondary | ICD-10-CM

## 2020-07-10 DIAGNOSIS — Z23 Encounter for immunization: Secondary | ICD-10-CM | POA: Diagnosis not present

## 2020-07-10 DIAGNOSIS — I10 Essential (primary) hypertension: Secondary | ICD-10-CM

## 2020-07-10 NOTE — Patient Instructions (Signed)
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

## 2020-07-10 NOTE — Progress Notes (Signed)
Subjective:    Patient ID: Joshua Fuller, male    DOB: 31-Mar-1946, 74 y.o.   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 Advised to take ASA and statin drug but he declined No chest pain, no SOB   MDD/OCD- followed by psychiatry for many years, has been stable on lamictal and [place] reviewed from April, Lipids normal  He had mild anemia Hb 12.8 , he has history of anemia, he is on iron supplement   PSA normal /Hep C neg  CMET normal    He was seen by Dr. 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 if you touch that spot, has had CT scans, [place] and biopsy that was diagnosis, surgical removal was not indicated   [facility] for eye exams  Immunizations- Feb14 Lot# [id number]  - [facility]     /March 15  [place]    Due for Flu shot due today   Review Of Systems:  GEN- denies fatigue, fever, weight loss,weakness, recent illness HEENT- denies eye drainage, change in vision, nasal discharge, CVS- denies chest pain, palpitations RESP- denies SOB, cough, wheeze ABD- denies N/V, change in stools, abd pain GU- denies dysuria, hematuria, dribbling, incontinence MSK- denies joint pain, muscle aches, injury Neuro- denies headache, dizziness, syncope, seizure activity       Objective:    BP 130/80 (BP Location: Right Arm, Patient Position: Sitting, Cuff  Size: Normal)   Pulse 71   Temp 97.9 F (36.6 C) (Oral)   Ht 5\' 8"  (1.727 m)   Wt 138 lb 6.4 oz (62.8 kg)   SpO2 98%   BMI 21.04 kg/m  GEN- NAD, alert and oriented x3 HEENT- PERRL, EOMI, non injected sclera, pink conjunctiva, MMM, oropharynx clear Neck- Supple, no thyromegaly, no bruit  CVS- RRR, no murmur RESP-CTAB ABD-NABS,soft,ND, point TTP LUQjust below rib cage Psych normal affect and mood EXT- No edema Pulses- Radial, DP- 2+        Assessment & Plan:      Problem List Items Addressed This Visit      Unprioritized   Aortic atherosclerosis ([place])    Seen by Cardiology in past, had stress test Declines ASA, Statin      Hypertension    Controlled off meds, will monitor Declines statin, recent lipids normal but has atherosclerosis      IDA (iron deficiency anemia)    Chronic def on iron supplement Has seen GI      MDD (major depressive disorder)    Per psychiatry       Other Visit Diagnoses    Need for immunization against influenza    -  Primary   Relevant Orders   Flu Vaccine QUAD High Dose(Fluad) (Completed)      Note: This dictation  was prepared with Dragon dictation along with smaller phrase technology. Any transcriptional errors that result from this process are unintentional.

## 2020-07-11 ENCOUNTER — Encounter: Payer: Self-pay | Admitting: Family Medicine

## 2020-07-11 DIAGNOSIS — F329 Major depressive disorder, single episode, unspecified: Secondary | ICD-10-CM | POA: Insufficient documentation

## 2020-07-11 DIAGNOSIS — I700 Atherosclerosis of aorta: Secondary | ICD-10-CM | POA: Insufficient documentation

## 2020-07-11 NOTE — Assessment & Plan Note (Signed)
Per psychiatry 

## 2020-07-11 NOTE — Assessment & Plan Note (Signed)
Chronic def on iron supplement Has seen GI

## 2020-07-11 NOTE — Assessment & Plan Note (Signed)
Controlled off meds, will monitor Declines statin, recent lipids normal but has atherosclerosis

## 2020-07-11 NOTE — Assessment & Plan Note (Signed)
Seen by Cardiology in past, had stress test Declines ASA, Statin

## 2020-08-13 DIAGNOSIS — F429 Obsessive-compulsive disorder, unspecified: Secondary | ICD-10-CM | POA: Diagnosis not present

## 2020-08-13 DIAGNOSIS — F330 Major depressive disorder, recurrent, mild: Secondary | ICD-10-CM | POA: Diagnosis not present

## 2020-08-13 DIAGNOSIS — F39 Unspecified mood [affective] disorder: Secondary | ICD-10-CM | POA: Diagnosis not present

## 2020-08-13 DIAGNOSIS — N529 Male erectile dysfunction, unspecified: Secondary | ICD-10-CM | POA: Diagnosis not present

## 2020-10-09 DIAGNOSIS — Z20822 Contact with and (suspected) exposure to covid-19: Secondary | ICD-10-CM | POA: Diagnosis not present

## 2020-11-18 DIAGNOSIS — N529 Male erectile dysfunction, unspecified: Secondary | ICD-10-CM | POA: Diagnosis not present

## 2020-11-18 DIAGNOSIS — F330 Major depressive disorder, recurrent, mild: Secondary | ICD-10-CM | POA: Diagnosis not present

## 2020-11-18 DIAGNOSIS — F39 Unspecified mood [affective] disorder: Secondary | ICD-10-CM | POA: Diagnosis not present

## 2020-11-18 DIAGNOSIS — F429 Obsessive-compulsive disorder, unspecified: Secondary | ICD-10-CM | POA: Diagnosis not present

## 2020-12-01 ENCOUNTER — Ambulatory Visit: Payer: Medicare Other | Admitting: Internal Medicine

## 2020-12-10 ENCOUNTER — Ambulatory Visit (INDEPENDENT_AMBULATORY_CARE_PROVIDER_SITE_OTHER): Payer: Medicare Other | Admitting: Internal Medicine

## 2020-12-10 ENCOUNTER — Encounter: Payer: Self-pay | Admitting: Internal Medicine

## 2020-12-10 ENCOUNTER — Other Ambulatory Visit: Payer: Self-pay

## 2020-12-10 VITALS — BP 122/78 | HR 68 | Resp 18 | Ht 68.0 in | Wt 131.4 lb

## 2020-12-10 DIAGNOSIS — J439 Emphysema, unspecified: Secondary | ICD-10-CM

## 2020-12-10 DIAGNOSIS — R634 Abnormal weight loss: Secondary | ICD-10-CM

## 2020-12-10 DIAGNOSIS — D500 Iron deficiency anemia secondary to blood loss (chronic): Secondary | ICD-10-CM | POA: Diagnosis not present

## 2020-12-10 DIAGNOSIS — Z7689 Persons encountering health services in other specified circumstances: Secondary | ICD-10-CM | POA: Diagnosis not present

## 2020-12-10 DIAGNOSIS — Z125 Encounter for screening for malignant neoplasm of prostate: Secondary | ICD-10-CM | POA: Diagnosis not present

## 2020-12-10 DIAGNOSIS — F17210 Nicotine dependence, cigarettes, uncomplicated: Secondary | ICD-10-CM

## 2020-12-10 DIAGNOSIS — Z85828 Personal history of other malignant neoplasm of skin: Secondary | ICD-10-CM

## 2020-12-10 DIAGNOSIS — F3340 Major depressive disorder, recurrent, in remission, unspecified: Secondary | ICD-10-CM

## 2020-12-10 DIAGNOSIS — F17200 Nicotine dependence, unspecified, uncomplicated: Secondary | ICD-10-CM | POA: Insufficient documentation

## 2020-12-10 DIAGNOSIS — E559 Vitamin D deficiency, unspecified: Secondary | ICD-10-CM

## 2020-12-10 DIAGNOSIS — D219 Benign neoplasm of connective and other soft tissue, unspecified: Secondary | ICD-10-CM

## 2020-12-10 DIAGNOSIS — I700 Atherosclerosis of aorta: Secondary | ICD-10-CM | POA: Diagnosis not present

## 2020-12-10 DIAGNOSIS — F1290 Cannabis use, unspecified, uncomplicated: Secondary | ICD-10-CM

## 2020-12-10 DIAGNOSIS — Z720 Tobacco use: Secondary | ICD-10-CM

## 2020-12-10 NOTE — Assessment & Plan Note (Signed)
On iron supplements Denies melena or hematochezia

## 2020-12-10 NOTE — Assessment & Plan Note (Addendum)
Follows up with Psychiatry, also reports h/o OCD On Lamictal and Viibryd

## 2020-12-10 NOTE — Assessment & Plan Note (Signed)
No breathing problems currently Emphysema noted on CT chest 

## 2020-12-10 NOTE — Assessment & Plan Note (Signed)
Smokes about 4-5 cigarettes/day  Asked about quitting: confirms that he currently smokes cigarettes Advise to quit smoking: Educated about QUITTING to reduce the risk of cancer, cardio and cerebrovascular disease. Assess willingness: Unwilling to quit at this time, but is working on cutting back. Assist with counseling and pharmacotherapy: Counseled for 5 minutes and literature provided. Arrange for follow up: Follow up in 3 months and continue to offer help.

## 2020-12-10 NOTE — Assessment & Plan Note (Signed)
S/p exicision Followed by Dermatology

## 2020-12-10 NOTE — Assessment & Plan Note (Signed)
Multifactorial Skips meals - advised to take at least 3 meals Marijuana use - advised to avoid Advised to take Ensure supplement

## 2020-12-10 NOTE — Assessment & Plan Note (Signed)
Denies taking statin Will check lipid profile

## 2020-12-10 NOTE — Progress Notes (Signed)
New Patient Office Visit  Subjective:  Patient ID: Joshua Fuller, male    DOB: 11-Nov-1945  Age: 75 y.o. MRN: [id number]  CC:  Chief Complaint  Patient presents with  . New Patient (Initial Visit)    New patient was seeing dr Buelah Manis has been losing weight since thanksgiving     HPI Joshua Fuller is a 75 year old male with PMH of COPD, depression SCC of skin and iron deficiency anemia who presents for establishing care.  He has been doing well overall.  He has a h/o depression and OCD. He follows up with Psychiatrist. He is on Viibryd and Lamictal. He denies anhedonia, SI or HI.  He has h/o anemia due to chronic blood loss in the past. He denies any melena or hematochezia currently. He is on iron supplements.  He states that he has been losing weight for last few months. He denies any night sweats, fever, chills, fatigue. Of note, he admits to skipping meals. He also smokes marijuana to feel better with anxiety. Denies any nausea or vomiting.  He has had COVID vaccine.  Past Medical History:  Diagnosis Date  . Anemia    Phreesia 03/09/2020  . Aortic atherosclerosis ([place])   . Chronic mental illness    OCD  . Depression    Phreesia 03/09/2020  . Helicobacter pylori gastritis 08/29/2013   TOOK PYLERA   . History of MRSA infection    of the skin  . Hypertension    Phreesia 03/09/2020  . IDA (iron deficiency anemia)   . Ileitis 11/06/2013  . Neurofibroma of abdominal wall     Past Surgical History:  Procedure Laterality Date  . ANKLE SURGERY    . APPENDECTOMY N/A    Phreesia 03/09/2020  . Capsule endoscopy  02/19/2007   KGM:WNUUVOZ view of gastric mucosa/Normal small bowel mucosa  . COLONOSCOPY  02/02/2007   SLF:Two 4-mm to 6-mm rectal polyps/sigmoid colon diverticulosis and internal hemorrhoids. hyperplastic polyps.   . COLONOSCOPY N/A 03/22/2013   SLF: ANEMIA MOST LIKELY DUE TO ULCERS IN terminal ileum/Moderate diverticulosis  in the sigmoid colon/ONE RECTAL  polyp removed(hyperplastic). ileum bx with focal active ileitis. next TCS 03/2023 with overtube  . COLONOSCOPY, ESOPHAGOGASTRODUODENOSCOPY (EGD) AND ESOPHAGEAL DILATION N/A 03/22/2013   DGU:[id number] DUE Stricture at the gastroesophageal junction/Large hiatal hernia/MILD Non-erosive gastritis & DUODENITIS (+h.pylori)  . DENTAL SURGERY  02/2020  . ESOPHAGOGASTRODUODENOSCOPY   02/02/2007   ZDG:LOVFI hiatal hernia closely examined and no evidence of [place] ulcers.  Otherwise, normal esophagus. Small bowel bx negative for celiac  . TONSILLECTOMY      Family History  Problem Relation Age of Onset  . Lung disease Mother   . Obesity Father   . Colon cancer Neg Hx     Social History   Socioeconomic History  . Marital status: Married    Spouse name: Not on file  . Number of children: 4  . Years of education: Not on file  . Highest education level: Not on file  Occupational History  . 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 Smoking status: Former Smoker    Types: Cigarettes    Quit date: 07/14/2019    Years since quitting: 1.4  . Smokeless tobacco: Never Used  . Tobacco comment: Smokes 7-10 cigarettes daily   Vaping Use  . Vaping Use: Never used  Substance and Sexual Activity  . Alcohol use: Yes    Comment: Couple glasses of wine per month   .  Drug use: No  . Sexual activity: Not on file  Other Topics Concern  . Not on file  Social History Narrative   Retired from [employer].    Social Determinants of Health   Financial Resource Strain: Not on file  Food Insecurity: Not on file  Transportation Needs: Not on file  Physical Activity: Not on file  Stress: Not on file  Social Connections: Not on file  Intimate Partner Violence: Not on file    ROS Review of Systems  Constitutional: Positive for unexpected weight change. Negative for chills and fever.  HENT: Negative for congestion and sore throat.   Eyes: Negative for pain and discharge.   Respiratory: Negative for cough and shortness of breath.   Cardiovascular: Negative for chest pain and palpitations.  Gastrointestinal: Negative for constipation, diarrhea, nausea and vomiting.  Endocrine: Negative for polydipsia and polyuria.  Genitourinary: Negative for dysuria and hematuria.  Musculoskeletal: Negative for neck pain and neck stiffness.  Skin: Negative for rash.  Neurological: Negative for dizziness, weakness, numbness and headaches.  Psychiatric/Behavioral: Negative for agitation and behavioral problems.    Objective:   Today's Vitals: BP 122/78 (BP Location: Right Arm, Patient Position: Sitting, Cuff Size: Normal)   Pulse 68   Resp 18   Ht 5\' 8"  (1.727 m)   Wt 131 lb 6.4 oz (59.6 kg)   SpO2 96%   BMI 19.98 kg/m   Physical Exam Vitals reviewed.  Constitutional:      General: He is not in acute distress.    Appearance: He is not diaphoretic.  HENT:     Head: Normocephalic and atraumatic.     Nose: Nose normal.     Mouth/Throat:     Mouth: Mucous membranes are moist.  Eyes:     General: No scleral icterus.    Extraocular Movements: Extraocular movements intact.  Cardiovascular:     Rate and Rhythm: Normal rate and regular rhythm.     Pulses: Normal pulses.     Heart sounds: Normal heart sounds. No murmur heard.   Pulmonary:     Breath sounds: Normal breath sounds. No wheezing or rales.  Abdominal:     Palpations: Abdomen is soft.     Tenderness: There is no abdominal tenderness.  Musculoskeletal:     Cervical back: Neck supple. No tenderness.     Right lower leg: No edema.     Left lower leg: No edema.  Skin:    General: Skin is warm.     Findings: No rash.  Neurological:     General: No focal deficit present.     Mental Status: He is alert and oriented to person, place, and time.     Sensory: No sensory deficit.     Motor: No weakness.  Psychiatric:        Mood and Affect: Mood normal.        Behavior: Behavior normal.     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 Assess willingness: Unwilling to quit at this time, but is working on cutting back. Assist with counseling and pharmacotherapy: Counseled for 5 minutes and literature provided. Arrange for follow up: Follow up in 3 months and continue to offer help.       Other Visit Diagnoses    Marijuana use          Outpatient Encounter Medications as of 12/10/2020  Medication Sig  . ferrous sulfate 325 (65 FE) MG EC tablet Take 325 mg by mouth daily with breakfast.  . lamoTRIgine (LAMICTAL) 100 MG tablet Take 100 mg by mouth daily.   . Multiple Vitamin (MULTIVITAMIN) capsule Take 1 capsule by mouth daily.  . Vilazodone HCl (VIIBRYD) 40 MG TABS Take 20 mg by mouth daily.    No facility-administered encounter medications on file as of 12/10/2020.    Follow-up: No follow-ups on file.   Lindell Spar, MD

## 2020-12-10 NOTE — Assessment & Plan Note (Signed)
Care established Previous chart reviewed History and medications reviewed with the patient 

## 2020-12-10 NOTE — Assessment & Plan Note (Signed)
Abdominal mass - had biopsy which showed myxoid fibroma

## 2020-12-10 NOTE — Assessment & Plan Note (Signed)
Could be contributing to weight loss Advised to avoid smoking marijuana

## 2020-12-10 NOTE — Patient Instructions (Signed)
Please continue to take medications as prescribed.  Please avoid marijuana use as it may be contributing to weight loss.  Please take Ensure supplements for protein supplement.

## 2020-12-11 ENCOUNTER — Other Ambulatory Visit: Payer: Self-pay | Admitting: Internal Medicine

## 2020-12-11 DIAGNOSIS — R972 Elevated prostate specific antigen [PSA]: Secondary | ICD-10-CM

## 2020-12-11 LAB — CMP14+EGFR
ALT: 16 IU/L (ref 0–44)
AST: 25 IU/L (ref 0–40)
Albumin/Globulin Ratio: 2.3 — ABNORMAL HIGH (ref 1.2–2.2)
Albumin: 4.5 g/dL (ref 3.7–4.7)
Alkaline Phosphatase: 68 IU/L (ref 44–121)
BUN/Creatinine Ratio: 21 (ref 10–24)
BUN: 21 mg/dL (ref 8–27)
Bilirubin Total: 0.5 mg/dL (ref 0.0–1.2)
CO2: 25 mmol/L (ref 20–29)
Calcium: 9.3 mg/dL (ref 8.6–10.2)
Chloride: 102 mmol/L (ref 96–106)
Creatinine, Ser: 0.99 mg/dL (ref 0.76–1.27)
Globulin, Total: 2 g/dL (ref 1.5–4.5)
Glucose: 85 mg/dL (ref 65–99)
Potassium: 4.7 mmol/L (ref 3.5–5.2)
Sodium: 140 mmol/L (ref 134–144)
Total Protein: 6.5 g/dL (ref 6.0–8.5)
eGFR: 80 mL/min/{1.73_m2} (ref >59)

## 2020-12-11 LAB — HEMOGLOBIN A1C
Est. average glucose Bld gHb Est-mCnc: 105 mg/dL
Hgb A1c MFr Bld: 5.3 % (ref 4.8–5.6)

## 2020-12-11 LAB — LIPID PANEL
Chol/HDL Ratio: 3 ratio (ref 0.0–5.0)
Cholesterol, Total: 146 mg/dL (ref 100–199)
HDL: 49 mg/dL (ref >39)
LDL Chol Calc (NIH): 87 mg/dL (ref 0–99)
Triglycerides: 43 mg/dL (ref 0–149)
VLDL Cholesterol Cal: 10 mg/dL (ref 5–40)

## 2020-12-11 LAB — CBC WITH DIFFERENTIAL/PLATELET
Basophils Absolute: 0.1 10*3/uL (ref 0.0–0.2)
Basos: 2 %
EOS (ABSOLUTE): 0.2 10*3/uL (ref 0.0–0.4)
Eos: 3 %
Hematocrit: 35.7 % — ABNORMAL LOW (ref 37.5–51.0)
Hemoglobin: 11.9 g/dL — ABNORMAL LOW (ref 13.0–17.7)
Immature Grans (Abs): 0 10*3/uL (ref 0.0–0.1)
Immature Granulocytes: 1 %
Lymphocytes Absolute: 1.9 10*3/uL (ref 0.7–3.1)
Lymphs: 33 %
MCH: 32.5 pg (ref 26.6–33.0)
MCHC: 33.3 g/dL (ref 31.5–35.7)
MCV: 98 fL — ABNORMAL HIGH (ref 79–97)
Monocytes Absolute: 0.6 10*3/uL (ref 0.1–0.9)
Monocytes: 11 %
Neutrophils Absolute: 3 10*3/uL (ref 1.4–7.0)
Neutrophils: 50 %
Platelets: 206 10*3/uL (ref 150–450)
RBC: 3.66 x10E6/uL — ABNORMAL LOW (ref 4.14–5.80)
RDW: 13.4 % (ref 11.6–15.4)
WBC: 5.8 10*3/uL (ref 3.4–10.8)

## 2020-12-11 LAB — PSA: Prostate Specific Ag, Serum: 4.3 ng/mL — ABNORMAL HIGH (ref 0.0–4.0)

## 2020-12-11 LAB — VITAMIN D 25 HYDROXY (VIT D DEFICIENCY, FRACTURES): Vit D, 25-Hydroxy: 32.6 ng/mL (ref 30.0–100.0)

## 2020-12-11 LAB — TSH+FREE T4
Free T4: 1.26 ng/dL (ref 0.82–1.77)
TSH: 2.6 u[IU]/mL (ref 0.450–4.500)

## 2021-01-25 ENCOUNTER — Other Ambulatory Visit: Payer: Self-pay

## 2021-01-25 ENCOUNTER — Ambulatory Visit (INDEPENDENT_AMBULATORY_CARE_PROVIDER_SITE_OTHER): Payer: Medicare Other | Admitting: Urology

## 2021-01-25 ENCOUNTER — Encounter: Payer: Self-pay | Admitting: Urology

## 2021-01-25 VITALS — BP 126/69 | HR 81 | Temp 98.1°F

## 2021-01-25 DIAGNOSIS — R972 Elevated prostate specific antigen [PSA]: Secondary | ICD-10-CM

## 2021-01-25 DIAGNOSIS — N5201 Erectile dysfunction due to arterial insufficiency: Secondary | ICD-10-CM | POA: Diagnosis not present

## 2021-01-25 LAB — URINALYSIS, ROUTINE W REFLEX MICROSCOPIC
Bilirubin, UA: NEGATIVE
Glucose, UA: NEGATIVE
Ketones, UA: NEGATIVE
Leukocytes,UA: NEGATIVE
Nitrite, UA: NEGATIVE
Protein,UA: NEGATIVE
RBC, UA: NEGATIVE
Specific Gravity, UA: 1.025 (ref 1.005–1.030)
Urobilinogen, Ur: 0.2 mg/dL (ref 0.2–1.0)
pH, UA: 7 (ref 5.0–7.5)

## 2021-01-25 LAB — BLADDER SCAN AMB NON-IMAGING: Scan Result: 34

## 2021-01-25 MED ORDER — SILDENAFIL CITRATE 20 MG PO TABS: 20.0000 mg | ORAL_TABLET | Freq: Every day | ORAL | 3 refills | Status: DC

## 2021-01-25 NOTE — Progress Notes (Signed)
post void residual= 34  Urological Symptom Review  Patient is experiencing the following symptoms: Frequent urination Get up at night to urinate Leakage of urine Stream starts and stops Trouble starting stream Weak stream Erection problems (male only)   Review of Systems  Gastrointestinal (upper)  : Negative for upper GI symptoms  Gastrointestinal (lower) : Negative for lower GI symptoms  Constitutional : Negative for symptoms  Skin: Negative for skin symptoms  Eyes: Negative for eye symptoms  Ear/Nose/Throat : Negative for Ear/Nose/Throat symptoms  Hematologic/Lymphatic: Easy bruising  Cardiovascular : Negative for cardiovascular symptoms  Respiratory : Negative for respiratory symptoms  Endocrine: Negative for endocrine symptoms  Musculoskeletal: Negative for musculoskeletal symptoms  Neurological: Negative for neurological symptoms  Psychologic: Depression Anxiety

## 2021-01-25 NOTE — Patient Instructions (Signed)
  Prostate-Specific Antigen Test Why am I having this test? The prostate-specific antigen (PSA) test is a screening test for prostate cancer. It can identify early signs of prostate cancer, which may allow for more effective treatment. Your health care provider may recommend that you have a PSA test starting at age 75 or that you have one earlier or later, depending on your risk factors for prostate cancer. You may also have a PSA test:  To monitor treatment of prostate cancer.  To check whether prostate cancer has returned after treatment.  If you have signs of other conditions that can affect PSA levels, such as: ? An enlarged prostate that is not caused by cancer (benign prostatic hyperplasia, BPH). This condition is very common in older men. ? A prostate infection. What is being tested? This test measures the amount of PSA in your blood. PSA is a protein that is made in the prostate. The prostate naturally produces more PSA as you age, but very high levels may be a sign of a medical condition. What kind of sample is taken? A blood sample is required for this test. It is usually collected by inserting a needle into a blood vessel or by sticking a finger with a small needle. Blood for this test should be drawn before having an exam of the prostate.   How do I prepare for this test? Do not ejaculate starting 24 hours before your test, or as long as told by your health care provider. Tell a health care provider about:  Any allergies you have.  All medicines you are taking, including vitamins, herbs, eye drops, creams, and over-the-counter medicines. This also includes: ? Medicines to assist with hair growth, such as finasteride. ? Any recent exposure to a medicine called diethylstilbestrol.  Any blood disorders you have.  Any recent procedures you have had, especially any procedures involving the prostate or rectum.  Any medical conditions you have.  Any recent urinary tract  infections (UTIs) you have had. How are the results reported? Your test results will be reported as a value that indicates how much PSA is in your blood. This will be given as nanograms of PSA per milliliter of blood (ng/mL). Your health care provider will compare your results to normal ranges that were established after testing a large group of people (reference ranges). Reference ranges may vary among labs and hospitals. PSA levels vary from person to person and generally increase with age. Because of this variation, there is no single PSA value that is considered normal for everyone. Instead, PSA reference ranges are used to describe whether your PSA levels are considered low or high (elevated). Common reference ranges are:  Low: 0-2.5 ng/mL.  Slightly to moderately elevated: 2.6-10.0 ng/mL.  Moderately elevated: 10.0-19.9 ng/mL.  Significantly elevated: 20 ng/mL or greater. Sometimes, the test results may report that a condition is present when it is not present (false-positive result). What do the results mean? A test result that is higher than 4 ng/mL may mean that you are at an increased risk for prostate cancer. However, a PSA test by itself is not enough to diagnose prostate cancer. High PSA levels may also be caused by the natural aging process, prostate infection, or BPH. PSA screening cannot tell you if your PSA is high due to cancer or a different cause. A prostate biopsy is the only way to diagnose prostate cancer. A risk of having the PSA test is diagnosing and treating prostate cancer that would never   have caused any symptoms or problems (overdiagnosis and overtreatment). Talk with your health care provider about what your results mean. Questions to ask your health care provider Ask your health care provider, or the department that is doing the test:  When will my results be ready?  How will I get my results?  What are my treatment options?  What other tests do I  need?  What are my next steps? Summary  The prostate-specific antigen (PSA) test is a screening test for prostate cancer.  Your health care provider may recommend that you have a PSA test starting at age 75 or that you have one earlier or later, depending on your risk factors for prostate cancer.  A test result that is higher than 4 ng/mL may mean that you are at an increased risk for prostate cancer. However, elevated levels can be caused by a number of conditions other than prostate cancer.  Talk with your health care provider about what your results mean. This information is not intended to replace advice given to you by your health care provider. Make sure you discuss any questions you have with your health care provider. Document Revised: 05/14/2020 Document Reviewed: 05/14/2020 Elsevier Patient Education  2021 [employer]  

## 2021-01-25 NOTE — Progress Notes (Signed)
01/25/2021 1:45 PM   Joshua Fuller 10-23-1945 [id number]  Referring provider: Lindell Spar, MD [address] [place],  [place] [address]  CC: PSA elevation  HPI: New patient-  1) PSA elevation- a 04/21 PSA 2.8 and a 03/22 PSA 4.3. Prostate was about 30 g on CT in 2015.  No prior bx.  No FH of PCa.  No h/o BPH. C/o some frequency, noc, intermittent flow, hesitancy, weak stream. AUASS = 12, QOL pleased.  His PVR is 34 ml today. UA clear.  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 He uses sildenafil. He tried a VED. He was in the [employer]. 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   No blood thinners.   PMH: Past Medical History:  Diagnosis Date  . Anemia    Phreesia 03/09/2020  . Aortic atherosclerosis ([place])   . Chronic mental illness    OCD  . Depression    Phreesia 03/09/2020  . Helicobacter pylori gastritis 08/29/2013   TOOK PYLERA   . History of MRSA infection    of the skin  . Hypertension    Phreesia 03/09/2020  . IDA (iron deficiency anemia)   . Ileitis 11/06/2013  . Neurofibroma of abdominal wall     Surgical History: Past Surgical History:  Procedure Laterality Date  . ANKLE SURGERY    . APPENDECTOMY N/A    Phreesia 03/09/2020  . Capsule endoscopy  02/19/2007   [id number] view of gastric mucosa/Normal small bowel mucosa  . COLONOSCOPY  02/02/2007   SLF:Two 4-mm to 6-mm rectal polyps/sigmoid colon diverticulosis and internal hemorrhoids. hyperplastic polyps.   . COLONOSCOPY N/A 03/22/2013   SLF: ANEMIA MOST LIKELY DUE TO ULCERS IN terminal ileum/Moderate diverticulosis  in the sigmoid colon/ONE RECTAL polyp removed(hyperplastic). ileum bx with focal active ileitis. next TCS 03/2023 with overtube  . COLONOSCOPY, ESOPHAGOGASTRODUODENOSCOPY (EGD) AND ESOPHAGEAL DILATION N/A 03/22/2013   [id number] DUE Stricture at the gastroesophageal junction/Large hiatal hernia/MILD Non-erosive gastritis & DUODENITIS (+h.pylori)  . DENTAL SURGERY  02/2020  .  ESOPHAGOGASTRODUODENOSCOPY   02/02/2007   KVQ:QVZDG hiatal hernia closely examined and no evidence of Cameron ulcers.  Otherwise, normal esophagus. Small bowel bx negative for celiac  . TONSILLECTOMY      Home Medications:  Allergies as of 01/25/2021      Reactions   Penicillins Rash   Has patient had a PCN reaction causing immediate rash, facial/tongue/throat swelling, SOB or lightheadedness with hypotension: yes Has patient had a PCN reaction causing severe rash involving mucus membranes or skin necrosis: no Has patient had a PCN reaction that required hospitalization: no Has patient had a PCN reaction occurring within the last 10 years: no If all of the above answers are "NO", then may proceed with Cephalosporin use.      Medication List       Accurate as of Jan 25, 2021  1:45 PM. If you have any questions, ask your nurse or doctor.        ferrous sulfate 325 (65 FE) MG EC tablet Take 325 mg by mouth daily with breakfast.   lamoTRIgine 100 MG tablet Commonly known as: LAMICTAL Take 100 mg by mouth daily.   multivitamin capsule Take 1 capsule by mouth daily.   Vilazodone HCl 40 MG Tabs Commonly known as: VIIBRYD Take 20 mg by mouth daily.       Allergies:  Allergies  Allergen Reactions  . Penicillins Rash    Has patient had a PCN reaction causing immediate  rash, facial/tongue/throat swelling, SOB or lightheadedness with hypotension: yes Has patient had a PCN reaction causing severe rash involving mucus membranes or skin necrosis: no Has patient had a PCN reaction that required hospitalization: no Has patient had a PCN reaction occurring within the last 10 years: no If all of the above answers are "NO", then may proceed with Cephalosporin use.     Family History: Family History  Problem Relation Age of Onset  . Lung disease Mother   . Obesity Father   . Colon cancer Neg Hx     Social History:  reports that he quit smoking about 18 months ago. His smoking  use included cigarettes. He has never used smokeless tobacco. He reports current alcohol use. He reports that he does not use drugs.   Physical Exam: There were no vitals taken for this visit.  Constitutional:  Alert and oriented, No acute distress. HEENT: [place] AT, moist mucus membranes.  Trachea midline, no masses. Cardiovascular: No clubbing, cyanosis, or edema. Respiratory: Normal respiratory effort, no increased work of breathing. GI: Abdomen is soft, nontender, nondistended, no abdominal masses GU: No CVA tenderness Lymph: No cervical or inguinal lymphadenopathy. Skin: No rashes, bruises or suspicious lesions. Neurologic: Grossly intact, no focal deficits, moving all 4 extremities. Psychiatric: Normal mood and affect. GU: Penis circumcised, normal foreskin, testicles descended bilaterally and palpably normal, bilateral epididymis palpably normal, scrotum normal DRE: Prostate 40 g, smooth without hard area or nodule   Laboratory Data: Lab Results  Component Value Date   WBC 5.8 12/10/2020   HGB 11.9 (L) 12/10/2020   HCT 35.7 (L) 12/10/2020   MCV 98 (H) 12/10/2020   PLT 206 12/10/2020    Lab Results  Component Value Date   CREATININE 0.99 12/10/2020    Lab Results  Component Value Date   PSA 2.8 12/19/2019    No results found for: TESTOSTERONE  Lab Results  Component Value Date   HGBA1C 5.3 12/10/2020    Urinalysis No results found for: COLORURINE, APPEARANCEUR, LABSPEC, PHURINE, GLUCOSEU, HGBUR, BILIRUBINUR, KETONESUR, PROTEINUR, UROBILINOGEN, NITRITE, LEUKOCYTESUR  No results found for: LABMICR, [place], RBCUA, LABEPIT, MUCUS, BACTERIA  Pertinent Imaging: CT 2015    Assessment & Plan:    1. Elevated PSA I had a long discussion with the patient on the nature of elevated PSA - benign vs malignant causes. We discussed age specific levels and that PCa can be seen on a biopsy with very low PSA levels (<=2.5). We discussed the nature risks and benefits of  continued surveillance, other lab tests, imaging as well as prostate biopsy. We discussed the management of prostate cancer might include active surveillance or treatment depending on biopsy findings. All questions answered. PSA was sent to determine f/u vs biopsy.  - Urinalysis, Routine w reflex microscopic   No follow-ups on file.  Festus Aloe, MD

## 2021-01-26 LAB — PSA: Prostate Specific Ag, Serum: 3.6 ng/mL (ref 0.0–4.0)

## 2021-01-27 NOTE — Progress Notes (Signed)
Sent via mychart

## 2021-03-18 DIAGNOSIS — Z20822 Contact with and (suspected) exposure to covid-19: Secondary | ICD-10-CM | POA: Diagnosis not present

## 2021-04-12 ENCOUNTER — Ambulatory Visit (INDEPENDENT_AMBULATORY_CARE_PROVIDER_SITE_OTHER): Payer: Medicare Other

## 2021-04-12 ENCOUNTER — Other Ambulatory Visit: Payer: Self-pay

## 2021-04-12 DIAGNOSIS — Z0000 Encounter for general adult medical examination without abnormal findings: Secondary | ICD-10-CM | POA: Diagnosis not present

## 2021-04-12 NOTE — Progress Notes (Signed)
Subjective:   Joshua Fuller is a 75 y.o. male who presents for Medicare Annual/Subsequent preventive examination.         Objective:    There were no vitals filed for this visit. There is no height or weight on file to calculate BMI.  Advanced Directives 02/09/2018 03/22/2013  Does Patient Have a Medical Advance Directive? No Patient does not have advance directive;Patient would not like information  Would patient like information on creating a medical advance directive? No - Patient declined -  Pre-existing out of facility DNR order (yellow form or pink MOST form) - No    Current Medications (verified) Outpatient Encounter Medications as of 04/12/2021  Medication Sig   ferrous sulfate 325 (65 FE) MG EC tablet Take 325 mg by mouth daily with breakfast.   lamoTRIgine (LAMICTAL) 100 MG tablet Take 100 mg by mouth daily.    Multiple Vitamin (MULTIVITAMIN) capsule Take 1 capsule by mouth daily.   sildenafil (REVATIO) 20 MG tablet Take 1 tablet (20 mg total) by mouth daily. Take 1-5 tablets as needed   Vilazodone HCl (VIIBRYD) 40 MG TABS Take 20 mg by mouth daily.    No facility-administered encounter medications on file as of 04/12/2021.    Allergies (verified) Penicillins   History: Past Medical History:  Diagnosis Date   Anemia    Phreesia 03/09/2020   Aortic atherosclerosis ([facility])    Chronic mental illness    OCD   Depression    Phreesia [id number]   Helicobacter pylori gastritis 08/29/2013   TOOK PYLERA    History of MRSA infection    of the skin   Hypertension    Phreesia 03/09/2020   IDA (iron deficiency anemia)    Ileitis 11/06/2013   Neurofibroma of abdominal wall    Past Surgical History:  Procedure Laterality Date   ANKLE SURGERY     APPENDECTOMY N/A    Phreesia 03/09/2020   Capsule endoscopy  02/19/2007   [id number] view of gastric mucosa/Normal small bowel mucosa   COLONOSCOPY  02/02/2007   SLF:Two 4-mm to 6-mm rectal polyps/sigmoid colon  diverticulosis and internal hemorrhoids. hyperplastic polyps.    COLONOSCOPY N/A 03/22/2013   SLF: ANEMIA MOST LIKELY DUE TO ULCERS IN terminal ileum/Moderate diverticulosis  in the sigmoid colon/ONE RECTAL polyp removed(hyperplastic). ileum bx with focal active ileitis. next TCS 03/2023 with overtube   COLONOSCOPY, ESOPHAGOGASTRODUODENOSCOPY (EGD) AND ESOPHAGEAL DILATION N/A 03/22/2013   [id number] DUE Stricture at the gastroesophageal junction/Large hiatal hernia/MILD Non-erosive gastritis & DUODENITIS (+h.pylori)   DENTAL SURGERY  02/2020   ESOPHAGOGASTRODUODENOSCOPY   02/02/2007   [id number] hiatal hernia closely examined and no evidence of Cameron ulcers.  Otherwise, normal esophagus. 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    Social Determinants of Health   Financial Resource Strain: Not on file  Food Insecurity: Not on file  Transportation Needs: Not on file  Physical Activity: Not on file  Stress: Not on file  Social Connections: Not on file    Tobacco Counseling Counseling given: Not Answered Tobacco  comments: Smokes 7-10 cigarettes daily    Clinical Intake:                 Diabetic? No          Activities of Daily Living In your present state of health, do you have any difficulty performing the following activities: 12/10/2020  Hearing? N  Vision? N  Difficulty concentrating or making decisions? N  Walking or climbing stairs? N  Dressing or bathing? N  Doing errands, shopping? N  Some recent data might be hidden    Patient Care Team: Lindell Spar, MD as PCP - General (Internal Medicine) Danie Binder, MD (Inactive) as Consulting Physician (Gastroenterology)  Indicate any recent Medical Services you may have received from other than [facility] providers in the past year (date may be approximate).     Assessment:   This is a routine wellness examination for Joshua Fuller.  Hearing/Vision screen No results found.  Dietary issues and exercise activities discussed:     Goals Addressed   None   Depression Screen PHQ 2/9 Scores 12/10/2020 07/10/2020 12/19/2019  PHQ - 2 Score 0 0 0  PHQ- 9 Score 0 - 0    Fall Risk Fall Risk  12/10/2020 07/10/2020 12/19/2019  Falls in the past year? 0 0 0  Number falls in past yr: 0 0 0  Injury with Fall? 0 0 -  Risk for fall due to : No Fall Risks - -  Follow up Falls evaluation completed Falls evaluation completed Falls evaluation completed    [place]:  Any stairs in or around the home? No  If so, are there any without handrails? No  Home free of loose throw rugs in walkways, pet beds, electrical cords, etc? Yes  Adequate lighting in your home to reduce risk of falls? Yes   ASSISTIVE DEVICES UTILIZED TO PREVENT FALLS:  Life alert? No  Use of a cane, walker or w/c? No  Grab bars in the bathroom? No  Shower chair or bench in shower? No  Elevated toilet seat or a handicapped toilet? No   TIMED UP AND GO:  Was the test performed? No .    Cognitive Function:         Immunizations Immunization History  Administered Date(s) Administered   Fluad Quad(high Dose 65+) 07/10/2020   Moderna SARS-COV2 Booster Vaccination 10/02/2020   [place] Sars-Covid-2 Vaccination 10/27/2019, 11/25/2019   Pneumococcal Conjugate-13 12/19/2019    TDAP status: Up to date  Flu Vaccine status: Up to date  Pneumococcal vaccine status: Up to date  Covid-19 vaccine status: Completed vaccines  Qualifies for Shingles Vaccine? Yes   Zostavax completed No   Shingrix Completed?: No.    Education has been provided regarding the importance of this vaccine. Patient has  been advised to call insurance company to determine out of pocket expense if they have not yet received this vaccine. Advised may also receive vaccine at local pharmacy or Health Dept. Verbalized acceptance and understanding.  Screening Tests Health Maintenance  Topic Date Due   Zoster Vaccines- Shingrix (1 of 2) Never done   PNA vac Low Risk Adult (2 of 2 - PPSV23) 12/18/2020   COVID-19 Vaccine (4 - Booster for Moderna series) 01/30/2021   INFLUENZA VACCINE  04/12/2021   TETANUS/TDAP  12/10/2021 (Originally 07/11/1965)   COLONOSCOPY (Pts 45-22yr Insurance coverage will need to be confirmed)  03/23/2023   Hepatitis C Screening  Completed   HPV VACCINES  Aged Out    Health Maintenance  Health Maintenance Due  Topic Date Due   Zoster Vaccines- Shingrix (1 of 2) Never done   PNA vac Low Risk Adult (2 of 2 - PPSV23) 12/18/2020   COVID-19 Vaccine (4 - Booster for Moderna series) 01/30/2021   INFLUENZA VACCINE  04/12/2021    Colorectal cancer screening: Type of screening: Colonoscopy. Completed normal. Repeat every 10 years  Lung Cancer Screening: (Low Dose CT Chest recommended if Age 75-80years, 30 pack-year currently smoking OR have quit w/in 15years.) does qualify.   Lung Cancer Screening Referral: Pt wants to hold off for now.   Additional Screening:  Hepatitis C Screening: does qualify;  Completed.   Vision Screening: Recommended annual ophthalmology exams for early detection of glaucoma and other disorders of the eye. Is the patient up to date with their annual eye exam?  Yes   Who is the provider or what is the name of the office in which the patient attends annual eye exams? [facility] [place], [place]   If pt is not established with a provider, would they like to be referred to a provider to establish care? No .   Dental Screening: Recommended annual dental exams for proper oral hygiene  Community Resource Referral / Chronic Care Management: CRR required this visit?  No   CCM required this visit?  No      Plan:     I have personally reviewed and noted the following in the patient's chart:   Medical and social history Use of alcohol, tobacco or illicit drugs  Current medications and supplements including opioid prescriptions. Patient is not currently taking opioid prescriptions. Functional ability and status Nutritional status Physical activity Advanced directives List of other physicians Hospitalizations, surgeries, and ER visits in previous 12 months Vitals Screenings to include cognitive, depression, and falls Referrals and appointments  In addition, I have reviewed and discussed with patient certain preventive protocols, quality metrics, and best practice recommendations. A written personalized care plan for preventive services as well as general preventive health recommendations were provided to patient.     ALonn Georgia LPN   [id number]  Nurse Notes: Annual Wellness conducted over the phone with pt consent to televisit via audio. Pt was present in the home at the time of call and provider located on site. This call took approx 25 min. To conduct.

## 2021-04-12 NOTE — Patient Instructions (Signed)
Joshua Fuller , Thank you for taking time to come for your Medicare Wellness Visit. I appreciate your ongoing commitment to your health goals. Please review the following plan we discussed and let me know if I can assist you in the future.   Screening recommendations/referrals: Colonoscopy: Up to date, due 03/23/2023  Recommended yearly ophthalmology/optometry visit for glaucoma screening and checkup Recommended yearly dental visit for hygiene and checkup  Vaccinations: Influenza vaccine: Up to date, due this Fall 2022 Pneumococcal vaccine: DUE! Tdap vaccine: Up to date, 12/10/2021 Shingles vaccine: Eligible, discussed checking with local pharmacy for pricing.   Advanced directives: Living Will   Conditions/risks identified: None   Next appointment: 75/29/22 @ 9:00 am with Dr. Posey Pronto  Preventive Care 75 Years and Older, Male Preventive care refers to lifestyle choices and visits with your health care provider that can promote health and wellness. What does preventive care include? A yearly physical exam. This is also called an annual well check. Dental exams once or twice a year. Routine eye exams. Ask your health care provider how often you should have your eyes checked. Personal lifestyle choices, including: Daily care of your teeth and gums. Regular physical activity. Eating a healthy diet. Avoiding tobacco and drug use. Limiting alcohol use. Practicing safe sex. Taking low doses of aspirin every day. Taking vitamin and mineral supplements as recommended by your health care provider. What happens during an annual well check? The services and screenings done by your health care provider during your annual well check will depend on your age, overall health, lifestyle risk factors, and family history of disease. Counseling  Your health care provider may ask you questions about your: Alcohol use. Tobacco use. Drug use. Emotional well-being. Home and relationship  well-being. Sexual activity. Eating habits. History of falls. Memory and ability to understand (cognition). Work and work Statistician. Screening  You may have the following tests or measurements: Height, weight, and BMI. Blood pressure. Lipid and cholesterol levels. These may be checked every 5 years, or more frequently if you are over 75 years old. Skin check. Lung cancer screening. You may have this screening every year starting at age 75 if you have a 30-pack-year history of smoking and currently smoke or have quit within the past 15 years. Fecal occult blood test (FOBT) of the stool. You may have this test every year starting at age 40. Flexible sigmoidoscopy or colonoscopy. You may have a sigmoidoscopy every 5 years or a colonoscopy every 10 years starting at age 75. Prostate cancer screening. Recommendations will vary depending on your family history and other risks. Hepatitis C blood test. Hepatitis B blood test. Sexually transmitted disease (STD) testing. Diabetes screening. This is done by checking your blood sugar (glucose) after you have not eaten for a while (fasting). You may have this done every 1-3 years. Abdominal aortic aneurysm (AAA) screening. You may need this if you are a current or former smoker. Osteoporosis. You may be screened starting at age 75 if you are at high risk. Talk with your health care provider about your test results, treatment options, and if necessary, the need for more tests. Vaccines  Your health care provider may recommend certain vaccines, such as: Influenza vaccine. This is recommended every year. Tetanus, diphtheria, and acellular pertussis (Tdap, Td) vaccine. You may need a Td booster every 10 years. Zoster vaccine. You may need this after age 75. Pneumococcal 13-valent conjugate (PCV13) vaccine. One dose is recommended after age 5. Pneumococcal polysaccharide (PPSV23) vaccine. One dose  is recommended after age 75. Talk to your health care  provider about which screenings and vaccines you need and how often you need them. This information is not intended to replace advice given to you by your health care provider. Make sure you discuss any questions you have with your health care provider. Document Released: 09/25/2015 Document Revised: 05/18/2016 Document Reviewed: 06/30/2015 Elsevier Interactive Patient Education  2017 [place] Prevention in the Home Falls can cause injuries. They can happen to people of all ages. There are many things you can do to make your home safe and to help prevent falls. What can I do on the outside of my home? Regularly fix the edges of walkways and driveways and fix any cracks. Remove anything that might make you trip as you walk through a door, such as a raised step or threshold. Trim any bushes or trees on the path to your home. Use bright outdoor lighting. Clear any walking paths of anything that might make someone trip, such as rocks or tools. Regularly check to see if handrails are loose or broken. Make sure that both sides of any steps have handrails. Any raised decks and porches should have guardrails on the edges. Have any leaves, snow, or ice cleared regularly. Use sand or salt on walking paths during winter. Clean up any spills in your garage right away. This includes oil or grease spills. What can I do in the bathroom? Use night lights. Install grab bars by the toilet and in the tub and shower. Do not use towel bars as grab bars. Use non-skid mats or decals in the tub or shower. If you need to sit down in the shower, use a plastic, non-slip stool. Keep the floor dry. Clean up any water that spills on the floor as soon as it happens. Remove soap buildup in the tub or shower regularly. Attach bath mats securely with double-sided non-slip rug tape. Do not have throw rugs and other things on the floor that can make you trip. What can I do in the bedroom? Use night lights. Make  sure that you have a light by your bed that is easy to reach. Do not use any sheets or blankets that are too big for your bed. They should not hang down onto the floor. Have a firm chair that has side arms. You can use this for support while you get dressed. Do not have throw rugs and other things on the floor that can make you trip. What can I do in the kitchen? Clean up any spills right away. Avoid walking on wet floors. Keep items that you use a lot in easy-to-reach places. If you need to reach something above you, use a strong step stool that has a grab bar. Keep electrical cords out of the way. Do not use floor polish or wax that makes floors slippery. If you must use wax, use non-skid floor wax. Do not have throw rugs and other things on the floor that can make you trip. What can I do with my stairs? Do not leave any items on the stairs. Make sure that there are handrails on both sides of the stairs and use them. Fix handrails that are broken or loose. Make sure that handrails are as long as the stairways. Check any carpeting to make sure that it is firmly attached to the stairs. Fix any carpet that is loose or worn. Avoid having throw rugs at the top or bottom of the stairs.  If you do have throw rugs, attach them to the floor with carpet tape. Make sure that you have a light switch at the top of the stairs and the bottom of the stairs. If you do not have them, ask someone to add them for you. What else can I do to help prevent falls? Wear shoes that: Do not have high heels. Have rubber bottoms. Are comfortable and fit you well. Are closed at the toe. Do not wear sandals. If you use a stepladder: Make sure that it is fully opened. Do not climb a closed stepladder. Make sure that both sides of the stepladder are locked into place. Ask someone to hold it for you, if possible. Clearly mark and make sure that you can see: Any grab bars or handrails. First and last steps. Where the  edge of each step is. Use tools that help you move around (mobility aids) if they are needed. These include: Canes. Walkers. Scooters. Crutches. Turn on the lights when you go into a dark area. Replace any light bulbs as soon as they burn out. Set up your furniture so you have a clear path. Avoid moving your furniture around. If any of your floors are uneven, fix them. If there are any pets around you, be aware of where they are. Review your medicines with your doctor. Some medicines can make you feel dizzy. This can increase your chance of falling. Ask your doctor what other things that you can do to help prevent falls. This information is not intended to replace advice given to you by your health care provider. Make sure you discuss any questions you have with your health care provider. Document Released: 06/25/2009 Document Revised: 02/04/2016 Document Reviewed: 10/03/2014 Elsevier Interactive Patient Education  2017 [employer].

## 2021-06-10 ENCOUNTER — Encounter: Payer: Self-pay | Admitting: Internal Medicine

## 2021-06-10 ENCOUNTER — Ambulatory Visit (INDEPENDENT_AMBULATORY_CARE_PROVIDER_SITE_OTHER): Payer: Medicare Other | Admitting: Internal Medicine

## 2021-06-10 ENCOUNTER — Other Ambulatory Visit: Payer: Self-pay

## 2021-06-10 VITALS — BP 139/75 | HR 62 | Temp 98.3°F | Resp 18 | Ht 68.0 in | Wt 136.0 lb

## 2021-06-10 DIAGNOSIS — R972 Elevated prostate specific antigen [PSA]: Secondary | ICD-10-CM | POA: Insufficient documentation

## 2021-06-10 DIAGNOSIS — Z720 Tobacco use: Secondary | ICD-10-CM

## 2021-06-10 DIAGNOSIS — Z23 Encounter for immunization: Secondary | ICD-10-CM

## 2021-06-10 DIAGNOSIS — N5201 Erectile dysfunction due to arterial insufficiency: Secondary | ICD-10-CM

## 2021-06-10 DIAGNOSIS — F3340 Major depressive disorder, recurrent, in remission, unspecified: Secondary | ICD-10-CM

## 2021-06-10 DIAGNOSIS — H6123 Impacted cerumen, bilateral: Secondary | ICD-10-CM | POA: Insufficient documentation

## 2021-06-10 DIAGNOSIS — E559 Vitamin D deficiency, unspecified: Secondary | ICD-10-CM

## 2021-06-10 DIAGNOSIS — J439 Emphysema, unspecified: Secondary | ICD-10-CM | POA: Diagnosis not present

## 2021-06-10 DIAGNOSIS — D509 Iron deficiency anemia, unspecified: Secondary | ICD-10-CM | POA: Diagnosis not present

## 2021-06-10 DIAGNOSIS — R634 Abnormal weight loss: Secondary | ICD-10-CM

## 2021-06-10 DIAGNOSIS — E782 Mixed hyperlipidemia: Secondary | ICD-10-CM | POA: Diagnosis not present

## 2021-06-10 NOTE — Assessment & Plan Note (Signed)
Multifactorial, now imptoving Skips meals - advised to take at least 3 meals Marijuana use - advised to avoid Advised to take Ensure supplement

## 2021-06-10 NOTE — Assessment & Plan Note (Signed)
Sildenafil prn, he takes it daily, advised to take it only as needed

## 2021-06-10 NOTE — Assessment & Plan Note (Signed)
Has chronic bruising Advised to continue iron supplements and take  Multivitamin daily

## 2021-06-10 NOTE — Assessment & Plan Note (Signed)
Smokes about 4-5 cigarettes/day  Asked about quitting: confirms that he currently smokes cigarettes Advise to quit smoking: Educated about QUITTING to reduce the risk of cancer, cardio and cerebrovascular disease. Assess willingness: Unwilling to quit at this time, but is working on cutting back. Assist with counseling and pharmacotherapy: Counseled for 5 minutes and literature provided. Arrange for follow up: Follow up in 3 months and continue to offer help.

## 2021-06-10 NOTE — Assessment & Plan Note (Signed)
Follows up with Psychiatry, also reports h/o OCD On Lamictal and Viibryd Needs to follow up with Psychiatry regularly 

## 2021-06-10 NOTE — Patient Instructions (Signed)
Please continue to take iron supplements and start taking multivitamin daily.  Please continue to follow up with your Psychiatrist.  Use Debrox ear drops to help with ear wax.  Please maintain simple sleep hygiene. - Maintain dark and non-noisy environment in the bedroom. - Please use the bedroom for sleep and sexual activity only. - Do not use electronic devices in the bedroom. - Please take dinner at least 2 hours before bedtime. - Please avoid caffeinated products in the evening, including coffee, soft drinks. - Please try to maintain the regular sleep-wake cycle - Go to bed and wake up at the same time.  Please get fasting blood tests before the next visit.

## 2021-06-10 NOTE — Progress Notes (Signed)
Established Patient Office Visit  Subjective:  Patient ID: Joshua Fuller, male    DOB: 1945/11/12  Age: 75 y.o. MRN: [id number]  CC:  Chief Complaint  Patient presents with   Follow-up    6 month follow up pt is not sleeping at night also he gets a lot of ear wax build up in the right ear he sleeps on this side and is constantly getting out ear wax     HPI Joshua Fuller  is a 75 year old male with PMH of COPD, depression, OCD, SCC of skin, erectile dysfunction and iron deficiency anemia who presents for follow up of his chronic medical conditions.  He has a h/o depression and OCD. He follows up with Psychiatrist. He is on Viibryd and Lamictal.  He admits that he missed his last appointment with his psychiatrist.  She reports anhedonia and insomnia.  He states that he talks to his mother at 8:30 PM PT, which is 11:30 PM EST for about an hour and it gets almost 1 AM when he goes to bed, which is affecting his sleep.  He has h/o anemia due to chronic blood loss in the past. He denies any melena or hematochezia currently. He is on iron supplements.  He has gained about 5 lbs since the last visit.  He visited Urologist for elevated PSA. His PSA has been stable.  He has started taking sildenafil for erectile dysfunction, but he reports that he has been taking it daily instead of as needed.  He denies any dysuria or hematuria currently.  He received flu vaccine in the office today.  Past Medical History:  Diagnosis Date   Anemia    Phreesia 03/09/2020   Aortic atherosclerosis (HCC)    Chronic mental illness    OCD   Depression    Phreesia [id number]   Helicobacter pylori gastritis 08/29/2013   TOOK PYLERA    History of MRSA infection    of the skin   Hypertension    Phreesia 03/09/2020   IDA (iron deficiency anemia)    Ileitis 11/06/2013   Neurofibroma of abdominal wall     Past Surgical History:  Procedure Laterality Date   ANKLE SURGERY     APPENDECTOMY N/A     Phreesia 03/09/2020   Capsule endoscopy  02/19/2007   ELF:YBOFBPZ view of gastric mucosa/Normal small bowel mucosa   COLONOSCOPY  02/02/2007   SLF:Two 4-mm to 6-mm rectal polyps/sigmoid colon diverticulosis and internal hemorrhoids. hyperplastic polyps.    COLONOSCOPY N/A 03/22/2013   SLF: ANEMIA MOST LIKELY DUE TO ULCERS IN terminal ileum/Moderate diverticulosis  in the sigmoid colon/ONE RECTAL polyp removed(hyperplastic). ileum bx with focal active ileitis. next TCS 03/2023 with overtube   COLONOSCOPY, ESOPHAGOGASTRODUODENOSCOPY (EGD) AND ESOPHAGEAL DILATION N/A 03/22/2013   [facility]:ENIDPOEUM DUE Stricture at the gastroesophageal junction/Large hiatal hernia/MILD Non-erosive gastritis & DUODENITIS (+h.pylori)   DENTAL SURGERY  02/2020   ESOPHAGOGASTRODUODENOSCOPY   02/02/2007   PNT:IRWER hiatal hernia closely examined and no evidence of Cameron ulcers.  Otherwise, normal esophagus. 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    Social Determinants of Health   Financial Resource Strain: Low Risk     Difficulty of Paying Living Expenses: Not hard at all  Food Insecurity: No Food Insecurity   Worried About Charity fundraiser in the Last Year: Never true   [place] in the Last Year: Never true  Transportation Needs: No Transportation Needs   Lack of Transportation (Medical): No   Lack of Transportation (Non-Medical): No  Physical Activity: Sufficiently Active   Days of Exercise per Week: 5 days   Minutes of Exercise per Session: 30 min  Stress: No Stress Concern Present   Feeling of Stress : Not at all  Social Connections: Socially Integrated   Frequency of Communication with Friends and Family: More than three times a week   Frequency of Social Gatherings with Friends and Family: Twice a week   Attends Religious Services: More than 4 times per year   Active Member of [employer] or Organizations: No   Attends Music therapist: More than 4 times per year   Marital Status: Married  Human resources officer Violence: Not At Risk   Fear of Current or Ex-Partner: No   Emotionally Abused: No   Physically Abused: No   Sexually Abused: No    Outpatient Medications Prior to Visit  Medication Sig Dispense Refill   ferrous sulfate 325 (65 FE) MG EC tablet Take 325 mg by mouth daily with breakfast.     lamoTRIgine (LAMICTAL) 100 MG tablet Take 100 mg by mouth daily.      Multiple Vitamin (MULTIVITAMIN) capsule Take 1 capsule by mouth daily.     sildenafil (REVATIO) 20 MG tablet Take 1 tablet (20 mg total) by mouth daily. Take 1-5 tablets as needed 90 tablet 3   Vilazodone HCl (VIIBRYD) 40 MG TABS Take 20 mg by mouth daily.      No facility-administered medications prior to visit.    Allergies  Allergen Reactions   Penicillins Rash    Has patient had a PCN reaction causing immediate rash, facial/tongue/throat swelling, SOB or lightheadedness with hypotension: yes Has patient had a PCN reaction causing severe rash involving mucus membranes or skin necrosis: no Has patient had a  PCN reaction that required hospitalization: no Has patient had a PCN reaction occurring within the last 10 years: no If all of the above answers are "NO", then may proceed with Cephalosporin use.     ROS Review of Systems  Constitutional:  Negative for chills and fever.  HENT:  Negative for congestion and sore throat.   Eyes:  Negative for pain and discharge.  Respiratory:  Negative for cough and shortness of breath.   Cardiovascular:  Negative for chest pain and palpitations.  Gastrointestinal:  Negative for constipation, diarrhea, nausea and vomiting.  Endocrine: Negative for polydipsia and polyuria.  Genitourinary:  Negative for dysuria and hematuria.  Musculoskeletal:  Negative for neck pain and neck stiffness.  Skin:  Negative for rash.  Neurological:  Negative for dizziness, weakness, numbness and headaches.  Psychiatric/Behavioral:  Positive for dysphoric mood and sleep disturbance. Negative for agitation, behavioral problems, hallucinations and suicidal ideas. The patient is nervous/anxious.      Objective:    Physical Exam Vitals reviewed.  Constitutional:      General: He is not in acute distress.    Appearance: He is not diaphoretic.  HENT:     Head: Normocephalic and atraumatic.     Nose: Nose normal.     Mouth/Throat:     Mouth: Mucous membranes are moist.  Eyes:     General: No scleral icterus.    Extraocular Movements: Extraocular movements intact.  Cardiovascular:     Rate and Rhythm: Normal rate and regular rhythm.     Pulses: Normal pulses.     Heart sounds: Normal heart sounds. No murmur heard. Pulmonary:     Breath sounds: Normal breath sounds. No wheezing or rales.  Abdominal:     Palpations: Abdomen is soft.     Tenderness: There is no abdominal tenderness.  Musculoskeletal:     Cervical back: Neck supple. No tenderness.     Right lower leg: No edema.     Left lower leg: No edema.  Skin:    General: Skin is warm.     Findings: No rash.   Neurological:     General: No focal deficit present.     Mental Status: He is alert and oriented to person, place, and time.     Sensory: No sensory deficit.     Motor: No weakness.  Psychiatric:        Mood and Affect: Mood normal.        Behavior: Behavior normal.    BP 139/75 (BP Location: Left Arm, Patient Position: Sitting, Cuff Size: Normal)   Pulse 62   Temp 98.3 F (36.8 C) (Oral)   Resp 18   Ht '5\' 8"'  (1.727 m)   Wt 136 lb 0.6 oz (61.7 kg)   SpO2 98%   BMI 20.68 kg/m  Wt Readings from Last 3 Encounters:  06/10/21 136 lb 0.6 oz (61.7 kg)  12/10/20 131 lb 6.4 oz (59.6 kg)  07/10/20 138 lb 6.4 oz (62.8 kg)     Health Maintenance Due  Topic Date Due   Zoster Vaccines- Shingrix (1 of 2) Never done   COVID-19 Vaccine (4 - Booster for Moderna series) 01/30/2021    There are no preventive care reminders to display for this patient.  Lab Results  Component Value Date   TSH 2.600 12/10/2020   Lab Results  Component Value Date   WBC 5.8 12/10/2020   HGB 11.9 (L) 12/10/2020   HCT 35.7 (L) 12/10/2020   MCV 98 (H) 12/10/2020   PLT 206 12/10/2020   Lab Results  Component Value Date   NA 140 12/10/2020   K 4.7 12/10/2020   CO2 25 12/10/2020   GLUCOSE 85 12/10/2020   BUN 21 12/10/2020   CREATININE 0.99 12/10/2020   BILITOT 0.5 12/10/2020   ALKPHOS 68 12/10/2020   AST 25 12/10/2020   ALT 16 12/10/2020   PROT 6.5 12/10/2020   ALBUMIN 4.5 12/10/2020   CALCIUM 9.3 12/10/2020   EGFR 80 12/10/2020   Lab Results  Component  Value Date   CHOL 146 12/10/2020   Lab Results  Component Value Date   HDL 49 12/10/2020   Lab Results  Component Value Date   LDLCALC 87 12/10/2020   Lab Results  Component Value Date   TRIG 43 12/10/2020   Lab Results  Component Value Date   CHOLHDL 3.0 12/10/2020   Lab Results  Component Value Date   HGBA1C 5.3 12/10/2020      Assessment & Plan:   Problem List Items Addressed This Visit       Respiratory    Emphysema of lung (HCC)    No breathing problems currently Emphysema noted on CT chest        Nervous and Auditory   Bilateral impacted cerumen     Other   Erectile dysfunction    Sildenafil prn, he takes it daily, advised to take it only as needed      MDD (major depressive disorder) - Primary    Follows up with Psychiatry, also reports h/o OCD On Lamictal and Viibryd Needs to follow up with Psychiatry regularly      Relevant Orders   CMP14+EGFR   TSH   Tobacco abuse    Smokes about 4-5 cigarettes/day  Asked about quitting: confirms that he currently smokes cigarettes Advise to quit smoking: Educated about QUITTING to reduce the risk of cancer, cardio and cerebrovascular disease. Assess willingness: Unwilling to quit at this time, but is working on cutting back. Assist with counseling and pharmacotherapy: Counseled for 5 minutes and literature provided. Arrange for follow up: Follow up in 3 months and continue to offer help.      Weight loss    Multifactorial, now imptoving Skips meals - advised to take at least 3 meals Marijuana use - advised to avoid Advised to take Ensure supplement        Relevant Orders   TSH   Elevated PSA    Had referred to Urology PSA stable Will recheck later      Relevant Orders   PSA   Microcytic anemia    Has chronic bruising Advised to continue iron supplements and take  Multivitamin daily      Relevant Orders   CBC with Differential/Platelet   Other Visit Diagnoses     Vitamin D deficiency       Relevant Orders   Vitamin D (25 hydroxy)   Mixed hyperlipidemia       Relevant Orders   Lipid panel   Need for immunization against influenza       Relevant Orders   Flu Vaccine QUAD High Dose(Fluad) (Completed)       No orders of the defined types were placed in this encounter.   Follow-up: Return in about 6 months (around 12/08/2021).    Lindell Spar, MD

## 2021-06-10 NOTE — Assessment & Plan Note (Signed)
No breathing problems currently Emphysema noted on CT chest 

## 2021-06-10 NOTE — Assessment & Plan Note (Signed)
Had referred to Urology PSA stable Will recheck later

## 2021-07-22 DIAGNOSIS — Z20822 Contact with and (suspected) exposure to covid-19: Secondary | ICD-10-CM | POA: Diagnosis not present

## 2021-07-26 DIAGNOSIS — Z20822 Contact with and (suspected) exposure to covid-19: Secondary | ICD-10-CM | POA: Diagnosis not present

## 2021-09-23 DIAGNOSIS — F429 Obsessive-compulsive disorder, unspecified: Secondary | ICD-10-CM | POA: Diagnosis not present

## 2021-09-23 DIAGNOSIS — F39 Unspecified mood [affective] disorder: Secondary | ICD-10-CM | POA: Diagnosis not present

## 2021-09-23 DIAGNOSIS — N529 Male erectile dysfunction, unspecified: Secondary | ICD-10-CM | POA: Diagnosis not present

## 2021-10-30 DIAGNOSIS — Z20822 Contact with and (suspected) exposure to covid-19: Secondary | ICD-10-CM | POA: Diagnosis not present

## 2021-11-30 DIAGNOSIS — F429 Obsessive-compulsive disorder, unspecified: Secondary | ICD-10-CM | POA: Diagnosis not present

## 2021-11-30 DIAGNOSIS — F39 Unspecified mood [affective] disorder: Secondary | ICD-10-CM | POA: Diagnosis not present

## 2021-11-30 DIAGNOSIS — N529 Male erectile dysfunction, unspecified: Secondary | ICD-10-CM | POA: Diagnosis not present

## 2021-12-04 DIAGNOSIS — Z20822 Contact with and (suspected) exposure to covid-19: Secondary | ICD-10-CM | POA: Diagnosis not present

## 2021-12-08 ENCOUNTER — Encounter: Payer: Self-pay | Admitting: Internal Medicine

## 2021-12-08 ENCOUNTER — Other Ambulatory Visit: Payer: Self-pay

## 2021-12-08 ENCOUNTER — Ambulatory Visit (INDEPENDENT_AMBULATORY_CARE_PROVIDER_SITE_OTHER): Payer: Medicare Other | Admitting: Internal Medicine

## 2021-12-08 VITALS — BP 136/82 | HR 76 | Ht 68.0 in | Wt 138.4 lb

## 2021-12-08 DIAGNOSIS — F3340 Major depressive disorder, recurrent, in remission, unspecified: Secondary | ICD-10-CM

## 2021-12-08 DIAGNOSIS — Z85828 Personal history of other malignant neoplasm of skin: Secondary | ICD-10-CM | POA: Diagnosis not present

## 2021-12-08 DIAGNOSIS — F17210 Nicotine dependence, cigarettes, uncomplicated: Secondary | ICD-10-CM

## 2021-12-08 DIAGNOSIS — D509 Iron deficiency anemia, unspecified: Secondary | ICD-10-CM

## 2021-12-08 DIAGNOSIS — Z23 Encounter for immunization: Secondary | ICD-10-CM

## 2021-12-08 DIAGNOSIS — R7303 Prediabetes: Secondary | ICD-10-CM | POA: Diagnosis not present

## 2021-12-08 DIAGNOSIS — I700 Atherosclerosis of aorta: Secondary | ICD-10-CM

## 2021-12-08 DIAGNOSIS — Z720 Tobacco use: Secondary | ICD-10-CM

## 2021-12-08 DIAGNOSIS — R972 Elevated prostate specific antigen [PSA]: Secondary | ICD-10-CM | POA: Diagnosis not present

## 2021-12-08 DIAGNOSIS — J439 Emphysema, unspecified: Secondary | ICD-10-CM | POA: Diagnosis not present

## 2021-12-08 NOTE — Progress Notes (Signed)
? ?Established Patient Office Visit ? ?Subjective:  ?Patient ID: Joshua Fuller, male    DOB: 10-15-1945  Age: 76 y.o. MRN: [id number] ? ?CC:  ?Chief Complaint  ?Patient presents with  ? Follow-up  ?  Pt complains of pain on middle finger on his left hand. Would also like to have a spot on his head looked at.   ? ? ?HPI ?Joshua Fuller is a 76 y.o. male with past medical history of COPD, depression, OCD, SCC of skin, erectile dysfunction and iron deficiency anemia who presents for follow up of his chronic medical conditions. ? ?He c/o pain over MCP joint of third digit of left hand along with mild local swelling. Denies any recent injury. Denies any numbness or weakness of hand. ? ?He has a h/o depression and OCD. He follows up with Psychiatrist. He is on Viibryd and Lamictal. He reports anhedonia and insomnia. He states that he talks to his mother at 8:30 PM PT, which is 11:30 PM EST for about an hour and it gets almost 1 AM when he goes to bed, which is affecting his sleep. ? ?He has h/o anemia due to chronic blood loss in the past. He denies any melena or hematochezia currently. He is on iron supplements. ? ?He also reports a lesion over his scalp, which has been present for the last few months. Of note, he has h/o SCC, which was treated by Dr Nevada Crane in the past. ? ?He received PCV20 in the office today. ? ?Past Medical History:  ?Diagnosis Date  ? Anemia   ? Phreesia 03/09/2020  ? Aortic atherosclerosis ([place])   ? Chronic mental illness   ? OCD  ? Depression   ? Phreesia 03/09/2020  ? Helicobacter pylori gastritis 08/29/2013  ? TOOK PYLERA   ? History of MRSA infection   ? of the skin  ? Hypertension   ? Phreesia 03/09/2020  ? IDA (iron deficiency anemia)   ? Ileitis 11/06/2013  ? Neurofibroma of abdominal wall   ? ? ?Past Surgical History:  ?Procedure Laterality Date  ? ANKLE SURGERY    ? APPENDECTOMY N/A   ? Phreesia 03/09/2020  ? Capsule endoscopy  02/19/2007  ? VPX:TGGYIRS view of gastric mucosa/Normal  small bowel mucosa  ? COLONOSCOPY  02/02/2007  ? SLF:Two 4-mm to 6-mm rectal polyps/sigmoid colon diverticulosis and internal hemorrhoids. hyperplastic polyps.   ? COLONOSCOPY N/A 03/22/2013  ? SLF: ANEMIA MOST LIKELY DUE TO ULCERS IN terminal ileum/Moderate diverticulosis  in the sigmoid colon/ONE RECTAL polyp removed(hyperplastic). ileum bx with focal active ileitis. next TCS 03/2023 with overtube  ? COLONOSCOPY, ESOPHAGOGASTRODUODENOSCOPY (EGD) AND ESOPHAGEAL DILATION N/A 03/22/2013  ? WNI:[id number] DUE Stricture at the gastroesophageal junction/Large hiatal hernia/MILD Non-erosive gastritis & DUODENITIS (+h.pylori)  ? DENTAL SURGERY  02/2020  ? ESOPHAGOGASTRODUODENOSCOPY   02/02/2007  ? [facility]:WEXHB hiatal hernia closely examined and no evidence of Cameron ulcers.  Otherwise, normal esophagus. Small bowel bx negative for celiac  ? TONSILLECTOMY    ? ? ?Family History  ?Problem Relation Age of Onset  ? Lung disease Mother   ? Obesity Father   ? Colon cancer Neg Hx   ? ? ?Social History  ? ?Socioeconomic History  ? Marital status: Married  ?  Spouse name: Not on file  ? Number of children: 4  ? Years of education: Not on file  ? Highest education level: Not on file  ?Occupational History  ? Occupation: Ambulance person  ?  Employer: Ambulance person  ?  Tobacco Use  ? Smoking status: Every Day  ?  Years: 18.00  ?  Types: Cigarettes  ?  Last attempt to quit: 07/14/2019  ?  Years since quitting: 2.4  ? Smokeless tobacco: Never  ? Tobacco comments:  ?  Smokes 7-10 cigarettes daily   ?Vaping Use  ? Vaping Use: Never used  ?Substance and Sexual Activity  ? Alcohol use: Yes  ?  Comment: Couple glasses of wine per month   ? Drug use: No  ? Sexual activity: Not on file  ?Other Topics Concern  ? Not on file  ?Social History Narrative  ? Retired from [employer].   ? ?Social Determinants of Health  ? ?Financial Resource Strain: Low Risk   ? Difficulty of Paying Living Expenses: Not hard at all  ?Food Insecurity: No Food Insecurity  ? Worried  About Charity fundraiser in the Last Year: Never true  ? Ran Out of Food in the Last Year: Never true  ?Transportation Needs: No Transportation Needs  ? Lack of Transportation (Medical): No  ? Lack of Transportation (Non-Medical): No  ?Physical Activity: Sufficiently Active  ? Days of Exercise per Week: 5 days  ? Minutes of Exercise per Session: 30 min  ?Stress: No Stress Concern Present  ? Feeling of Stress : Not at all  ?Social Connections: Socially Integrated  ? Frequency of Communication with Friends and Family: More than three times a week  ? Frequency of Social Gatherings with Friends and Family: Twice a week  ? Attends Religious Services: More than 4 times per year  ? Active Member of Clubs or Organizations: No  ? Attends Archivist Meetings: More than 4 times per year  ? Marital Status: Married  ?Intimate Partner Violence: Not At Risk  ? Fear of Current or Ex-Partner: No  ? Emotionally Abused: No  ? Physically Abused: No  ? Sexually Abused: No  ? ? ?Outpatient Medications Prior to Visit  ?Medication Sig Dispense Refill  ? ferrous sulfate 325 (65 FE) MG EC tablet Take 325 mg by mouth daily with breakfast.    ? lamoTRIgine (LAMICTAL) 100 MG tablet Take 100 mg by mouth daily.     ? Multiple Vitamin (MULTIVITAMIN) capsule Take 1 capsule by mouth daily.    ? sildenafil (REVATIO) 20 MG tablet Take 1 tablet (20 mg total) by mouth daily. Take 1-5 tablets as needed 90 tablet 3  ? Vilazodone HCl (VIIBRYD) 40 MG TABS Take 20 mg by mouth daily.     ? ?No facility-administered medications prior to visit.  ? ? ?Allergies  ?Allergen Reactions  ? Penicillins Rash  ?  Has patient had a PCN reaction causing immediate rash, facial/tongue/throat swelling, SOB or lightheadedness with hypotension: yes ?Has patient had a PCN reaction causing severe rash involving mucus membranes or skin necrosis: no ?Has patient had a PCN reaction that required hospitalization: no ?Has patient had a PCN reaction occurring within the  last 10 years: no ?If all of the above answers are "NO", then may proceed with Cephalosporin use. ?  ? ? ?ROS ?Review of Systems  ?Constitutional:  Negative for chills and fever.  ?HENT:  Negative for congestion and sore throat.   ?Eyes:  Negative for pain and discharge.  ?Respiratory:  Negative for cough and shortness of breath.   ?Cardiovascular:  Negative for chest pain and palpitations.  ?Gastrointestinal:  Negative for diarrhea, nausea and vomiting.  ?Endocrine: Negative for polydipsia and polyuria.  ?Genitourinary:  Negative for  dysuria and hematuria.  ?Musculoskeletal:  Positive for arthralgias (Left hand with swelling). Negative for neck pain and neck stiffness.  ?Skin:  Negative for rash.  ?Neurological:  Negative for dizziness, weakness, numbness and headaches.  ?Psychiatric/Behavioral:  Positive for dysphoric mood and sleep disturbance. Negative for agitation, behavioral problems, hallucinations and suicidal ideas. The patient is nervous/anxious.   ? ?  ?Objective:  ?  ?Physical Exam ?Vitals reviewed.  ?Constitutional:   ?   General: He is not in acute distress. ?   Appearance: He is not diaphoretic.  ?HENT:  ?   Head: Normocephalic and atraumatic.  ?   Nose: Nose normal.  ?   Mouth/Throat:  ?   Mouth: Mucous membranes are moist.  ?Eyes:  ?   General: No scleral icterus. ?   Extraocular Movements: Extraocular movements intact.  ?Cardiovascular:  ?   Rate and Rhythm: Normal rate and regular rhythm.  ?   Pulses: Normal pulses.  ?   Heart sounds: Normal heart sounds. No murmur heard. ?Pulmonary:  ?   Breath sounds: Normal breath sounds. No wheezing or rales.  ?Musculoskeletal:     ?   General: Swelling (MCP, PIP and DIP joints of left hand - Mild) present.  ?   Cervical back: Neck supple. No tenderness.  ?   Right lower leg: No edema.  ?   Left lower leg: No edema.  ?Skin: ?   General: Skin is warm.  ?   Findings: No rash.  ?   Comments: Erythematous papule over scalp  ?Neurological:  ?   General: No focal  deficit present.  ?   Mental Status: He is alert and oriented to person, place, and time.  ?   Sensory: No sensory deficit.  ?   Motor: No weakness.  ?Psychiatric:     ?   Mood and Affect: Mood normal.     ?

## 2021-12-08 NOTE — Patient Instructions (Signed)
Please continue to take medications as prescribed. ? ?Please continue to follow low salt diet and ambulate as tolerated. ? ?Please use Voltaren gel as needed for arthritic pain. ?

## 2021-12-09 ENCOUNTER — Encounter: Payer: Self-pay | Admitting: Internal Medicine

## 2021-12-09 LAB — CBC WITH DIFFERENTIAL/PLATELET
Basophils Absolute: 0.1 10*3/uL (ref 0.0–0.2)
Basos: 1 %
EOS (ABSOLUTE): 0.1 10*3/uL (ref 0.0–0.4)
Eos: 2 %
Hematocrit: 38.9 % (ref 37.5–51.0)
Hemoglobin: 13.1 g/dL (ref 13.0–17.7)
Immature Grans (Abs): 0 10*3/uL (ref 0.0–0.1)
Immature Granulocytes: 1 %
Lymphocytes Absolute: 1.6 10*3/uL (ref 0.7–3.1)
Lymphs: 28 %
MCH: 33.2 pg — ABNORMAL HIGH (ref 26.6–33.0)
MCHC: 33.7 g/dL (ref 31.5–35.7)
MCV: 99 fL — ABNORMAL HIGH (ref 79–97)
Monocytes Absolute: 0.6 10*3/uL (ref 0.1–0.9)
Monocytes: 10 %
Neutrophils Absolute: 3.3 10*3/uL (ref 1.4–7.0)
Neutrophils: 58 %
Platelets: 222 10*3/uL (ref 150–450)
RBC: 3.94 x10E6/uL — ABNORMAL LOW (ref 4.14–5.80)
RDW: 14.2 % (ref 11.6–15.4)
WBC: 5.7 10*3/uL (ref 3.4–10.8)

## 2021-12-09 LAB — HEMOGLOBIN A1C
Est. average glucose Bld gHb Est-mCnc: 105 mg/dL
Hgb A1c MFr Bld: 5.3 % (ref 4.8–5.6)

## 2021-12-09 LAB — LIPID PANEL
Chol/HDL Ratio: 3.3 ratio (ref 0.0–5.0)
Cholesterol, Total: 193 mg/dL (ref 100–199)
HDL: 59 mg/dL (ref >39)
LDL Chol Calc (NIH): 125 mg/dL — ABNORMAL HIGH (ref 0–99)
Triglycerides: 49 mg/dL (ref 0–149)
VLDL Cholesterol Cal: 9 mg/dL (ref 5–40)

## 2021-12-09 LAB — CMP14+EGFR
ALT: 16 IU/L (ref 0–44)
AST: 20 IU/L (ref 0–40)
Albumin/Globulin Ratio: 2.4 — ABNORMAL HIGH (ref 1.2–2.2)
Albumin: 4.8 g/dL — ABNORMAL HIGH (ref 3.7–4.7)
Alkaline Phosphatase: 75 IU/L (ref 44–121)
BUN/Creatinine Ratio: 19 (ref 10–24)
BUN: 18 mg/dL (ref 8–27)
Bilirubin Total: 0.6 mg/dL (ref 0.0–1.2)
CO2: 27 mmol/L (ref 20–29)
Calcium: 9.4 mg/dL (ref 8.6–10.2)
Chloride: 101 mmol/L (ref 96–106)
Creatinine, Ser: 0.97 mg/dL (ref 0.76–1.27)
Globulin, Total: 2 g/dL (ref 1.5–4.5)
Glucose: 86 mg/dL (ref 70–99)
Potassium: 5.3 mmol/L — ABNORMAL HIGH (ref 3.5–5.2)
Sodium: 140 mmol/L (ref 134–144)
Total Protein: 6.8 g/dL (ref 6.0–8.5)
eGFR: 81 mL/min/{1.73_m2} (ref >59)

## 2021-12-09 LAB — PSA: Prostate Specific Ag, Serum: 3.1 ng/mL (ref 0.0–4.0)

## 2021-12-09 LAB — TSH: TSH: 1.95 u[IU]/mL (ref 0.450–4.500)

## 2021-12-09 NOTE — Assessment & Plan Note (Signed)
Follows up with Psychiatry, also reports h/o OCD On Lamictal and Viibryd Needs to follow up with Psychiatry regularly 

## 2021-12-09 NOTE — Assessment & Plan Note (Signed)
No breathing problems currently Emphysema noted on CT chest 

## 2021-12-09 NOTE — Assessment & Plan Note (Signed)
Smokes about 4-5 cigarettes/day ? ?Asked about quitting: confirms that he currently smokes cigarettes ?Advise to quit smoking: Educated about QUITTING to reduce the risk of cancer, cardio and cerebrovascular disease. ?Assess willingness: Unwilling to quit at this time, but is working on cutting back. ?Assist with counseling and pharmacotherapy: Counseled for 5 minutes and literature provided. ?Arrange for follow up: Follow up in 3 months and continue to offer help. ?

## 2021-12-09 NOTE — Assessment & Plan Note (Signed)
Denies taking statin ?Will check lipid profile ?

## 2021-12-09 NOTE — Assessment & Plan Note (Signed)
S/p exicision in the past ?Followed by Dermatology ?Has a new lesion over scalp, referred to Dermatology ?

## 2021-12-09 NOTE — Assessment & Plan Note (Signed)
Has chronic bruising ?Advised to continue iron supplements and take Multivitamin daily ?

## 2021-12-11 DIAGNOSIS — Z20822 Contact with and (suspected) exposure to covid-19: Secondary | ICD-10-CM | POA: Diagnosis not present

## 2021-12-29 DIAGNOSIS — Z20822 Contact with and (suspected) exposure to covid-19: Secondary | ICD-10-CM | POA: Diagnosis not present

## 2022-01-05 DIAGNOSIS — L570 Actinic keratosis: Secondary | ICD-10-CM | POA: Diagnosis not present

## 2022-01-05 DIAGNOSIS — X32XXXD Exposure to sunlight, subsequent encounter: Secondary | ICD-10-CM | POA: Diagnosis not present

## 2022-01-05 DIAGNOSIS — D225 Melanocytic nevi of trunk: Secondary | ICD-10-CM | POA: Diagnosis not present

## 2022-01-13 DIAGNOSIS — Z20822 Contact with and (suspected) exposure to covid-19: Secondary | ICD-10-CM | POA: Diagnosis not present

## 2022-03-02 DIAGNOSIS — F39 Unspecified mood [affective] disorder: Secondary | ICD-10-CM | POA: Diagnosis not present

## 2022-03-02 DIAGNOSIS — F429 Obsessive-compulsive disorder, unspecified: Secondary | ICD-10-CM | POA: Diagnosis not present

## 2022-03-02 DIAGNOSIS — N529 Male erectile dysfunction, unspecified: Secondary | ICD-10-CM | POA: Diagnosis not present

## 2022-03-03 ENCOUNTER — Encounter: Payer: Self-pay | Admitting: Internal Medicine

## 2022-03-03 ENCOUNTER — Ambulatory Visit (INDEPENDENT_AMBULATORY_CARE_PROVIDER_SITE_OTHER): Payer: Medicare Other | Admitting: Internal Medicine

## 2022-03-03 VITALS — BP 124/86 | HR 48 | Resp 18 | Ht 68.0 in | Wt 139.4 lb

## 2022-03-03 DIAGNOSIS — M5416 Radiculopathy, lumbar region: Secondary | ICD-10-CM | POA: Insufficient documentation

## 2022-03-03 DIAGNOSIS — M5432 Sciatica, left side: Secondary | ICD-10-CM

## 2022-03-03 NOTE — Assessment & Plan Note (Signed)
His left buttock area pain likely radiating pain from lumbar spine Check x-ray lumbar spine Could be lumbar spinal stenosis as he has relief of pain with bending Tylenol or ibuprofen as needed for pain Simple back exercises material provided

## 2022-03-03 NOTE — Progress Notes (Signed)
Acute Office Visit  Subjective:    Patient ID: Joshua Fuller, male    DOB: 1945-12-23, 76 y.o.   MRN: [id number]  Chief Complaint  Patient presents with   Hip Pain    Pt has hip pain when laying down at night since 01-10-22 when he lays down pain is a 3 this is the left hip     HPI Patient is in today for c/o left hip/buttock area pain, which is worse upon lying flat.  It gets better with lying on the right side and bending.  Upon further evaluation, he reports pain on the right lower paralumbar area.  He denies any recent injury or fall.  Denies any heavy lifting or frequent bending.  His pain is about 3/10 at nighttime, dull and intermittent.  Denies any urinary or stool incontinence.  Past Medical History:  Diagnosis Date   Anemia    Phreesia 03/09/2020   Aortic atherosclerosis ([facility])    Chronic mental illness    OCD   Depression    Phreesia [id number]/78/6767   Helicobacter pylori gastritis 08/29/2013   TOOK PYLERA    History of MRSA infection    of the skin   Hypertension    Phreesia 03/09/2020   IDA (iron deficiency anemia)    Ileitis 11/06/2013   Neurofibroma of abdominal wall     Past Surgical History:  Procedure Laterality Date   ANKLE SURGERY     APPENDECTOMY N/A    Phreesia 03/09/2020   Capsule endoscopy  02/19/2007   MCN:OBSJGGE view of gastric mucosa/Normal small bowel mucosa   COLONOSCOPY  02/02/2007   SLF:Two 4-mm to 6-mm rectal polyps/sigmoid colon diverticulosis and internal hemorrhoids. hyperplastic polyps.    COLONOSCOPY N/A 03/22/2013   SLF: ANEMIA MOST LIKELY DUE TO ULCERS IN terminal ileum/Moderate diverticulosis  in the sigmoid colon/ONE RECTAL polyp removed(hyperplastic). ileum bx with focal active ileitis. next TCS 03/2023 with overtube   COLONOSCOPY, ESOPHAGOGASTRODUODENOSCOPY (EGD) AND ESOPHAGEAL DILATION N/A 03/22/2013   ZMO:[id number] DUE Stricture at the gastroesophageal junction/Large hiatal hernia/MILD Non-erosive gastritis & DUODENITIS  (+h.pylori)   DENTAL SURGERY  02/2020   ESOPHAGOGASTRODUODENOSCOPY   02/02/2007   KPT:WSFKC hiatal hernia closely examined and no evidence of Cameron ulcers.  Otherwise, normal esophagus. 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    Social Determinants  of Health   Financial Resource Strain: Low Risk  (04/12/2021)   Overall Financial Resource Strain (CARDIA)    Difficulty of Paying Living Expenses: Not hard at all  Food Insecurity: No Food Insecurity (04/12/2021)   Hunger Vital Sign    Worried About Running Out of Food in the Last Year: Never true    [place] in the Last Year: Never true  Transportation Needs: No Transportation Needs (04/12/2021)   PRAPARE - Hydrologist (Medical): No    Lack of Transportation (Non-Medical): No  Physical Activity: Sufficiently Active (04/12/2021)   Exercise Vital Sign    Days of Exercise per Week: 5 days    Minutes of Exercise per Session: 30 min  Stress: No Stress Concern Present (04/12/2021)   [place]    Feeling of Stress : Not at all  Social Connections: Socially Integrated (04/12/2021)   Social Connection and Isolation Panel [NHANES]    Frequency of Communication with Friends and Family: More than three times a week    Frequency of Social Gatherings with Friends and Family: Twice a week    Attends Religious Services: More than 4 times per year    Active Member of [employer] or Organizations: No    Attends Music therapist: More than 4 times per year    Marital Status: Married  Human resources officer Violence: Not At Risk (04/12/2021)   Humiliation, Afraid, Rape, and Kick questionnaire    Fear of Current or Ex-Partner: No    Emotionally Abused: No    Physically Abused: No    Sexually Abused: No    Outpatient Medications Prior to Visit  Medication Sig Dispense Refill   ferrous sulfate 325 (65 FE) MG EC tablet Take 325 mg by mouth daily with breakfast.     lamoTRIgine (LAMICTAL) 100 MG tablet Take 100 mg by mouth daily.      Multiple Vitamin (MULTIVITAMIN) capsule Take 1 capsule by mouth daily.     sildenafil (REVATIO) 20 MG tablet Take 1 tablet (20 mg total) by mouth daily. Take 1-5 tablets as needed 90 tablet 3   Vilazodone HCl (VIIBRYD) 40 MG TABS Take 20 mg by mouth daily.      No facility-administered medications prior to visit.    Allergies  Allergen Reactions   Penicillins Rash    Has patient had a PCN reaction causing immediate rash, facial/tongue/throat swelling, SOB or lightheadedness with hypotension: yes Has patient had a PCN reaction causing severe rash involving mucus membranes or skin necrosis: no Has patient had a PCN reaction that required hospitalization: no Has patient had a PCN reaction occurring within the last 10 years: no If all of the above answers are "NO", then may proceed with Cephalosporin use.     Review of Systems  Constitutional:  Negative for chills and fever.  Respiratory:  Negative for cough and  shortness of breath.   Cardiovascular:  Negative for chest pain and palpitations.  Musculoskeletal:  Positive for back pain. Negative for neck pain and neck stiffness.  Skin:  Negative for rash.  Neurological:  Negative for dizziness and weakness.  Psychiatric/Behavioral:  Negative for agitation and behavioral problems.        Objective:    Physical Exam Vitals reviewed.  Constitutional:      General: He is not in acute distress.    Appearance: He is not diaphoretic.  HENT:     Head:  Normocephalic and atraumatic.     Nose: Nose normal.     Mouth/Throat:     Mouth: Mucous membranes are moist.  Eyes:     General: No scleral icterus.    Extraocular Movements: Extraocular movements intact.  Cardiovascular:     Rate and Rhythm: Normal rate and regular rhythm.     Heart sounds: Normal heart sounds. No murmur heard. Pulmonary:     Breath sounds: Normal breath sounds. No wheezing or rales.  Musculoskeletal:        General: Swelling (MCP, PIP and DIP joints of left hand - Mild) present.     Cervical back: Neck supple. No tenderness.     Right lower leg: No edema.     Left lower leg: No edema.     Comments: No lumbar spinal tenderness ROM at hip normal  Skin:    General: Skin is warm.     Findings: No rash.     Comments: Erythematous papule over scalp  Neurological:     General: No focal deficit present.     Mental Status: He is alert and oriented to person, place, and time.     Sensory: No sensory deficit.     Motor: No weakness.  Psychiatric:        Mood and Affect: Mood normal.        Behavior: Behavior normal.     BP 124/86 (BP Location: Right Arm, Patient Position: Sitting, Cuff Size: Normal)   Pulse (!) 48   Resp 18   Ht '5\' 8"'$  (1.727 m)   Wt 139 lb 6.4 oz (63.2 kg)   SpO2 98%   BMI 21.20 kg/m  Wt Readings from Last 3 Encounters:  03/03/22 139 lb 6.4 oz (63.2 kg)  12/08/21 138 lb 6.4 oz (62.8 kg)  06/10/21 136 lb 0.6 oz (61.7 kg)        Assessment & Plan:    Problem List Items Addressed This Visit       Nervous and Auditory   Sciatica of left side - Primary    His left buttock area pain likely radiating pain from lumbar spine Check x-ray lumbar spine Could be lumbar spinal stenosis as he has relief of pain with bending Tylenol or ibuprofen as needed for pain Simple back exercises material provided      Relevant Orders   DG Lumbar Spine Complete     No orders of the defined types were placed in this encounter.    Lindell Spar, MD

## 2022-03-08 ENCOUNTER — Ambulatory Visit (HOSPITAL_COMMUNITY)
Admission: RE | Admit: 2022-03-08 | Discharge: 2022-03-08 | Disposition: A | Discharge status: Home / Self Care | Payer: Medicare Other | Source: Ambulatory Visit | Attending: Internal Medicine | Admitting: Internal Medicine

## 2022-03-08 DIAGNOSIS — M5136 Other intervertebral disc degeneration, lumbar region: Secondary | ICD-10-CM | POA: Diagnosis not present

## 2022-03-08 DIAGNOSIS — M5432 Sciatica, left side: Secondary | ICD-10-CM | POA: Diagnosis not present

## 2022-03-08 DIAGNOSIS — M545 Low back pain, unspecified: Secondary | ICD-10-CM | POA: Diagnosis not present

## 2022-04-13 ENCOUNTER — Ambulatory Visit (INDEPENDENT_AMBULATORY_CARE_PROVIDER_SITE_OTHER): Payer: Medicare Other

## 2022-04-13 DIAGNOSIS — Z0000 Encounter for general adult medical examination without abnormal findings: Secondary | ICD-10-CM

## 2022-04-13 NOTE — Progress Notes (Signed)
Subjective:   Joshua Fuller is a 76 y.o. male who presents for an Initial Medicare Annual Wellness Visit.  I connected with  Joshua Fuller on 04/13/22 by a audio enabled telemedicine application and verified that I am speaking with the correct person using two identifiers.  Patient Location: Home  Provider Location: Office/Clinic  I discussed the limitations of evaluation and management by telemedicine. The patient expressed understanding and agreed to proceed.   Review of Systems     Joshua Fuller , Thank you for taking time to come for your Medicare Wellness Visit. I appreciate your ongoing commitment to your health goals. Please review the following plan we discussed and let me know if I can assist you in the future.   These are the goals we discussed:  Goals   None     This is a list of the screening recommended for you and due dates:  Health Maintenance  Topic Date Due   Tetanus Vaccine  Never done   Zoster (Shingles) Vaccine (1 of 2) Never done   COVID-19 Vaccine (3 - Moderna series) 11/27/2020   Flu Shot  04/12/2022   Colon Cancer Screening  03/23/2023   Pneumonia Vaccine  Completed   Hepatitis C Screening: USPSTF Recommendation to screen - Ages 18-79 yo.  Completed   HPV Vaccine  Aged Out          Objective:    There were no vitals filed for this visit. There is no height or weight on file to calculate BMI.     04/12/2021   10:39 AM 02/09/2018   11:49 AM 03/22/2013   10:01 AM  Advanced Directives  Does Patient Have a Medical Advance Directive? Yes No Patient does not have advance directive;Patient would not like information  Type of Advance Directive Living will    Does patient want to make changes to medical advance directive? No - Patient declined    Would patient like information on creating a medical advance directive? No - Patient declined No - Patient declined   Pre-existing out of facility DNR order (yellow form or pink MOST form)   No     Current Medications (verified) Outpatient Encounter Medications as of 04/13/2022  Medication Sig   ferrous sulfate 325 (65 FE) MG EC tablet Take 325 mg by mouth daily with breakfast.   lamoTRIgine (LAMICTAL) 100 MG tablet Take 100 mg by mouth daily.    Multiple Vitamin (MULTIVITAMIN) capsule Take 1 capsule by mouth daily.   sildenafil (REVATIO) 20 MG tablet Take 1 tablet (20 mg total) by mouth daily. Take 1-5 tablets as needed   Vilazodone HCl (VIIBRYD) 40 MG TABS Take 20 mg by mouth daily.    No facility-administered encounter medications on file as of 04/13/2022.    Allergies (verified) Penicillins   History: Past Medical History:  Diagnosis Date   Anemia    Phreesia 03/09/2020   Aortic atherosclerosis (HCC)    Chronic mental illness    OCD   Depression    Phreesia 52/84/1324   Helicobacter pylori gastritis 08/29/2013   TOOK PYLERA    History of MRSA infection    of the skin   Hypertension    Phreesia 03/09/2020   IDA (iron deficiency anemia)    Ileitis 11/06/2013   Neurofibroma of abdominal wall    Past Surgical History:  Procedure Laterality Date   ANKLE SURGERY     APPENDECTOMY N/A    Phreesia 03/09/2020   Capsule endoscopy  02/19/2007   [facility]:XNATFTD view of gastric mucosa/Normal small bowel mucosa   COLONOSCOPY  02/02/2007   SLF:Two 4-mm to 6-mm rectal polyps/sigmoid colon diverticulosis and internal hemorrhoids. hyperplastic polyps.    COLONOSCOPY N/A 03/22/2013   SLF: ANEMIA MOST LIKELY DUE TO ULCERS IN terminal ileum/Moderate diverticulosis  in the sigmoid colon/ONE RECTAL polyp removed(hyperplastic). ileum bx with focal active ileitis. next TCS 03/2023 with overtube   COLONOSCOPY, ESOPHAGOGASTRODUODENOSCOPY (EGD) AND ESOPHAGEAL DILATION N/A 03/22/2013   DUK:GURKYHCWC DUE Stricture at the gastroesophageal junction/Large hiatal hernia/MILD Non-erosive gastritis & DUODENITIS (+h.pylori)   DENTAL SURGERY  02/2020   ESOPHAGOGASTRODUODENOSCOPY   02/02/2007    BJS:EGBTD hiatal hernia closely examined and no evidence of Cameron ulcers.  Otherwise, normal esophagus. 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    Social Determinants of Health   Financial Resource Strain: Low Risk  (04/12/2021)   Overall Financial Resource Strain (CARDIA)    Difficulty of Paying Living Expenses: Not hard at all  Food Insecurity: No Food Insecurity (04/12/2021)   Hunger Vital Sign    Worried About Running Out of Food in the Last Year: Never true    Howe in the Last Year: Never true  Transportation Needs: No Transportation Needs (04/12/2021)   PRAPARE - Hydrologist (Medical): No    Lack of Transportation (Non-Medical): No  Physical Activity: Sufficiently Active (04/12/2021)   Exercise Vital Sign    Days of Exercise per Week: 5 days    Minutes of Exercise per Session: 30 min  Stress: No Stress Concern Present (04/12/2021)   [place]    Feeling of Stress : Not at all  Social  Connections: [place] (04/12/2021)   Social Connection and Isolation Panel [NHANES]    Frequency of Communication with Friends and Family: More than three times a week    Frequency of Social Gatherings with Friends and Family: Twice a week    Attends Religious Services: More than 4 times per year    Active Member of [employer] or Organizations: No    Attends Music therapist: More than 4 times per year    Marital Status: Married    Tobacco Counseling Ready to quit: Not Answered Counseling given: Not Answered Tobacco comments: Smokes 7-10 cigarettes  daily    Clinical Intake:              How often do you need to have someone help you when you read instructions, pamphlets, or other written materials from your doctor or pharmacy?: (P) 1 - Never  Diabetic? No         Activities of Daily Living    04/09/2022    8:42 AM  In your present state of health, do you have any difficulty performing the following activities:  Hearing? 1  Vision? 1  Difficulty concentrating or making decisions? 0  Walking or climbing stairs? 0  Dressing or bathing? 0  Doing errands, shopping? 0  Preparing Food and eating ? N  Using the Toilet? N  In the past six months, have you accidently leaked urine? Y  Do you have problems with loss of bowel control? N  Managing your Medications? N  Managing your Finances? N  Housekeeping or managing your Housekeeping? N    Patient Care Team: Lindell Spar, MD as PCP - General (Internal Medicine) Danie Binder, MD (Inactive) as Consulting Physician (Gastroenterology)  Indicate any recent Medical Services you may have received from other than [facility] providers in the past year (date may be approximate).     Assessment:   This is a routine wellness examination for [place].  Hearing/Vision screen No results found.  Dietary issues and exercise activities discussed:     Goals Addressed   None   Depression Screen    03/03/2022    11:02 AM 12/08/2021   10:09 AM 06/10/2021    9:11 AM 04/12/2021   10:41 AM 04/12/2021   10:37 AM 12/10/2020    9:08 AM 07/10/2020   11:39 AM  PHQ 2/9 Scores  PHQ - 2 Score 0 0 1 0 0 0 0  PHQ- 9 Score 0  10   0     Fall Risk    04/09/2022    8:42 AM 03/03/2022   11:02 AM 12/08/2021   10:08 AM 06/10/2021    9:11 AM 04/12/2021   10:41 AM  Fall Risk   Falls in the past year? 0 0 0 0 0  Number falls in past yr: 0 0 0 0 0  Injury with Fall? 0 0 0 0 0  Risk for fall due to :  No Fall Risks No Fall Risks No Fall Risks Other (Comment)  Follow up  Falls evaluation completed Falls evaluation completed Falls evaluation completed Falls evaluation completed    FALL RISK PREVENTION PERTAINING TO THE HOME:  Any stairs in or around the home? No  If so, are there any without handrails?  N/A Home free of loose throw rugs in walkways, pet beds, electrical cords, etc? Yes  Adequate lighting in your home to reduce risk of falls? Yes   ASSISTIVE DEVICES UTILIZED TO PREVENT FALLS:  Life alert? No  Use of a cane, walker or w/c? No  Grab bars in the bathroom? No  Shower chair or bench in shower? No  Elevated toilet seat or a handicapped toilet? No    Cognitive Function:        04/12/2021   10:48 AM  6CIT Screen  What Year? 0 points  What month? 0 points  What time? 0 points  Count back from 20 0 points  Months in reverse 0 points  Repeat phrase 0 points  Total Score 0 points    Immunizations Immunization History  Administered Date(s) Administered  Fluad Quad(high Dose 65+) 07/10/2020, 06/10/2021   Moderna SARS-COV2 Booster Vaccination 10/02/2020   Moderna Sars-Covid-2 Vaccination 10/27/2019, 11/25/2019   PNEUMOCOCCAL CONJUGATE-20 12/08/2021   Pneumococcal Conjugate-13 12/19/2019    TDAP status: Due, Education has been provided regarding the importance of this vaccine. Advised may receive this vaccine at local pharmacy or Health Dept. Aware to provide a copy of the vaccination record  if obtained from local pharmacy or Health Dept. Verbalized acceptance and understanding.  Flu Vaccine status: Due, Education has been provided regarding the importance of this vaccine. Advised may receive this vaccine at local pharmacy or Health Dept. Aware to provide a copy of the vaccination record if obtained from local pharmacy or Health Dept. Verbalized acceptance and understanding.  Pneumococcal vaccine status: Up to date  Covid-19 vaccine status: Completed vaccines  Qualifies for Shingles Vaccine? Yes   Zostavax completed No   Shingrix Completed?: No.    Education has been provided regarding the importance of this vaccine. Patient has been advised to call insurance company to determine out of pocket expense if they have not yet received this vaccine. Advised may also receive vaccine at local pharmacy or Health Dept. Verbalized acceptance and understanding.  Screening Tests Health Maintenance  Topic Date Due   TETANUS/TDAP  Never done   Zoster Vaccines- Shingrix (1 of 2) Never done   COVID-19 Vaccine (3 - Moderna series) 11/27/2020   INFLUENZA VACCINE  04/12/2022   COLONOSCOPY (Pts 45-76yr Insurance coverage will need to be confirmed)  03/23/2023   Pneumonia Vaccine 76 Years old  Completed   Hepatitis C Screening  Completed   HPV VACCINES  Aged Out    Health Maintenance  Health Maintenance Due  Topic Date Due   TETANUS/TDAP  Never done   Zoster Vaccines- Shingrix (1 of 2) Never done   COVID-19 Vaccine (3 - Moderna series) 11/27/2020   INFLUENZA VACCINE  04/12/2022    Colorectal cancer screening: Type of screening: Colonoscopy. Completed 03/23/2023. Repeat every 10 years  Lung Cancer Screening: (Low Dose CT Chest recommended if Age 76-80years, 30 pack-year currently smoking OR have quit w/in 15years.) does qualify.    Additional Screening:  Hepatitis C Screening: does qualify; Completed 12/19/2019  Vision Screening: Recommended annual ophthalmology exams for early  detection of glaucoma and other disorders of the eye. Is the patient up to date with their annual eye exam?  No  Who is the provider or what is the name of the office in which the patient attends annual eye exams? [facility]  Dental Screening: Recommended annual dental exams for proper oral hygiene  Community Resource Referral / Chronic Care Management: CRR required this visit?  No   CCM required this visit?  No      Plan:     I have personally reviewed and noted the following in the patient's chart:   Medical and social history Use of alcohol, tobacco or illicit drugs  Current medications and supplements including opioid prescriptions. Patient is not currently taking opioid prescriptions. Functional ability and status Nutritional status Physical activity Advanced directives List of other physicians Hospitalizations, surgeries, and ER visits in previous 12 months Vitals Screenings to include cognitive, depression, and falls Referrals and appointments  In addition, I have reviewed and discussed with patient certain preventive protocols, quality metrics, and best practice recommendations. A written personalized care plan for preventive services as well as general preventive health recommendations were provided to patient.     [place]y [place]illing [place]  04/13/2022  Nurse Notes:  Mr. Lupe , Thank you for taking time to come for your Medicare Wellness Visit. I appreciate your ongoing commitment to your health goals. Please review the following plan we discussed and let me know if I can assist you in the future.   These are the goals we discussed:  Goals   None     This is a list of the screening recommended for you and due dates:  Health Maintenance  Topic Date Due   Tetanus Vaccine  Never done   Zoster (Shingles) Vaccine (1 of 2) Never done   COVID-19 Vaccine (3 - Moderna series) 11/27/2020   Flu Shot  04/12/2022   Colon Cancer Screening  03/23/2023   Pneumonia Vaccine   Completed   Hepatitis C Screening: USPSTF Recommendation to screen - Ages 18-79 yo.  Completed   HPV Vaccine  Aged Out

## 2022-04-13 NOTE — Patient Instructions (Signed)
Joshua Fuller , Thank you for taking time to come for your Medicare Wellness Visit. I appreciate your ongoing commitment to your health goals. Please review the following plan we discussed and let me know if I can assist you in the future.   Screening recommendations/referrals: Colonoscopy: Completed Recommended yearly ophthalmology/optometry visit for glaucoma screening and checkup Recommended yearly dental visit for hygiene and checkup  Vaccinations: Influenza vaccine: Due Pneumococcal vaccine: Completed Tdap vaccine: Due Shingles vaccine: Due    Advanced directives: patient decline  Conditions/risks identified: falls, hypertension  Next appointment: 1 year  Preventive Care 76 Years and Older, Male Preventive care refers to lifestyle choices and visits with your health care provider that can promote health and wellness. What does preventive care include? A yearly physical exam. This is also called an annual well check. Dental exams once or twice a year. Routine eye exams. Ask your health care provider how often you should have your eyes checked. Personal lifestyle choices, including: Daily care of your teeth and gums. Regular physical activity. Eating a healthy diet. Avoiding tobacco and drug use. Limiting alcohol use. Practicing safe sex. Taking low doses of aspirin every day. Taking vitamin and mineral supplements as recommended by your health care provider. What happens during an annual well check? The services and screenings done by your health care provider during your annual well check will depend on your age, overall health, lifestyle risk factors, and family history of disease. Counseling  Your health care provider may ask you questions about your: Alcohol use. Tobacco use. Drug use. Emotional well-being. Home and relationship well-being. Sexual activity. Eating habits. History of falls. Memory and ability to understand (cognition). Work and work  Statistician. Screening  You may have the following tests or measurements: Height, weight, and BMI. Blood pressure. Lipid and cholesterol levels. These may be checked every 5 years, or more frequently if you are over 78 years old. Skin check. Lung cancer screening. You may have this screening every year starting at age 81 if you have a 30-pack-year history of smoking and currently smoke or have quit within the past 15 years. Fecal occult blood test (FOBT) of the stool. You may have this test every year starting at age 21. Flexible sigmoidoscopy or colonoscopy. You may have a sigmoidoscopy every 5 years or a colonoscopy every 10 years starting at age 56. Prostate cancer screening. Recommendations will vary depending on your family history and other risks. Hepatitis C blood test. Hepatitis B blood test. Sexually transmitted disease (STD) testing. Diabetes screening. This is done by checking your blood sugar (glucose) after you have not eaten for a while (fasting). You may have this done every 1-3 years. Abdominal aortic aneurysm (AAA) screening. You may need this if you are a current or former smoker. Osteoporosis. You may be screened starting at age 11 if you are at high risk. Talk with your health care provider about your test results, treatment options, and if necessary, the need for more tests. Vaccines  Your health care provider may recommend certain vaccines, such as: Influenza vaccine. This is recommended every year. Tetanus, diphtheria, and acellular pertussis (Tdap, Td) vaccine. You may need a Td booster every 10 years. Zoster vaccine. You may need this after age 52. Pneumococcal 13-valent conjugate (PCV13) vaccine. One dose is recommended after age 87. Pneumococcal polysaccharide (PPSV23) vaccine. One dose is recommended after age 81. Talk to your health care provider about which screenings and vaccines you need and how often you need them. This information  is not intended to replace  advice given to you by your health care provider. Make sure you discuss any questions you have with your health care provider. Document Released: 09/25/2015 Document Revised: 05/18/2016 Document Reviewed: 06/30/2015 Elsevier Interactive Patient Education  2017 [place] Prevention in the Home Falls can cause injuries. They can happen to people of all ages. There are many things you can do to make your home safe and to help prevent falls. What can I do on the outside of my home? Regularly fix the edges of walkways and driveways and fix any cracks. Remove anything that might make you trip as you walk through a door, such as a raised step or threshold. Trim any bushes or trees on the path to your home. Use bright outdoor lighting. Clear any walking paths of anything that might make someone trip, such as rocks or tools. Regularly check to see if handrails are loose or broken. Make sure that both sides of any steps have handrails. Any raised decks and porches should have guardrails on the edges. Have any leaves, snow, or ice cleared regularly. Use sand or salt on walking paths during winter. Clean up any spills in your garage right away. This includes oil or grease spills. What can I do in the bathroom? Use night lights. Install grab bars by the toilet and in the tub and shower. Do not use towel bars as grab bars. Use non-skid mats or decals in the tub or shower. If you need to sit down in the shower, use a plastic, non-slip stool. Keep the floor dry. Clean up any water that spills on the floor as soon as it happens. Remove soap buildup in the tub or shower regularly. Attach bath mats securely with double-sided non-slip rug tape. Do not have throw rugs and other things on the floor that can make you trip. What can I do in the bedroom? Use night lights. Make sure that you have a light by your bed that is easy to reach. Do not use any sheets or blankets that are too big for your bed.  They should not hang down onto the floor. Have a firm chair that has side arms. You can use this for support while you get dressed. Do not have throw rugs and other things on the floor that can make you trip. What can I do in the kitchen? Clean up any spills right away. Avoid walking on wet floors. Keep items that you use a lot in easy-to-reach places. If you need to reach something above you, use a strong step stool that has a grab bar. Keep electrical cords out of the way. Do not use floor polish or wax that makes floors slippery. If you must use wax, use non-skid floor wax. Do not have throw rugs and other things on the floor that can make you trip. What can I do with my stairs? Do not leave any items on the stairs. Make sure that there are handrails on both sides of the stairs and use them. Fix handrails that are broken or loose. Make sure that handrails are as long as the stairways. Check any carpeting to make sure that it is firmly attached to the stairs. Fix any carpet that is loose or worn. Avoid having throw rugs at the top or bottom of the stairs. If you do have throw rugs, attach them to the floor with carpet tape. Make sure that you have a light switch at the top of  the stairs and the bottom of the stairs. If you do not have them, ask someone to add them for you. What else can I do to help prevent falls? Wear shoes that: Do not have high heels. Have rubber bottoms. Are comfortable and fit you well. Are closed at the toe. Do not wear sandals. If you use a stepladder: Make sure that it is fully opened. Do not climb a closed stepladder. Make sure that both sides of the stepladder are locked into place. Ask someone to hold it for you, if possible. Clearly mark and make sure that you can see: Any grab bars or handrails. First and last steps. Where the edge of each step is. Use tools that help you move around (mobility aids) if they are needed. These  include: Canes. Walkers. Scooters. Crutches. Turn on the lights when you go into a dark area. Replace any light bulbs as soon as they burn out. Set up your furniture so you have a clear path. Avoid moving your furniture around. If any of your floors are uneven, fix them. If there are any pets around you, be aware of where they are. Review your medicines with your doctor. Some medicines can make you feel dizzy. This can increase your chance of falling. Ask your doctor what other things that you can do to help prevent falls. This information is not intended to replace advice given to you by your health care provider. Make sure you discuss any questions you have with your health care provider. Document Released: 06/25/2009 Document Revised: 02/04/2016 Document Reviewed: 10/03/2014 Elsevier Interactive Patient Education  2017 [employer].

## 2022-06-01 DIAGNOSIS — F429 Obsessive-compulsive disorder, unspecified: Secondary | ICD-10-CM | POA: Diagnosis not present

## 2022-06-01 DIAGNOSIS — F39 Unspecified mood [affective] disorder: Secondary | ICD-10-CM | POA: Diagnosis not present

## 2022-06-01 DIAGNOSIS — N529 Male erectile dysfunction, unspecified: Secondary | ICD-10-CM | POA: Diagnosis not present

## 2022-06-09 ENCOUNTER — Telehealth: Payer: Self-pay

## 2022-06-09 NOTE — Telephone Encounter (Signed)
[facility] request received [facility] revatio.  Patient was supposed to be placed on a recall list for a 1 year f/u with Dr. Junious Silk around 01/2022.  Called patient and left voicemail informing him of needing a 1 year f/u.  Waiting for call back.

## 2022-06-14 ENCOUNTER — Ambulatory Visit (INDEPENDENT_AMBULATORY_CARE_PROVIDER_SITE_OTHER): Payer: Medicare Other | Admitting: Internal Medicine

## 2022-06-14 ENCOUNTER — Encounter: Payer: Self-pay | Admitting: Internal Medicine

## 2022-06-14 VITALS — Temp 99.3°F

## 2022-06-14 DIAGNOSIS — Z20822 Contact with and (suspected) exposure to covid-19: Secondary | ICD-10-CM

## 2022-06-14 DIAGNOSIS — J069 Acute upper respiratory infection, unspecified: Secondary | ICD-10-CM

## 2022-06-14 DIAGNOSIS — F3340 Major depressive disorder, recurrent, in remission, unspecified: Secondary | ICD-10-CM | POA: Diagnosis not present

## 2022-06-14 LAB — POCT INFLUENZA A/B
Influenza A, POC: NEGATIVE
Influenza B, POC: NEGATIVE

## 2022-06-14 NOTE — Addendum Note (Signed)
Addended by: Eual Fines on: 06/14/2022 01:18 PM   Modules accepted: Orders

## 2022-06-14 NOTE — Progress Notes (Signed)
Virtual Visit via Telephone Note   This visit type was conducted due to national recommendations for restrictions regarding the COVID-19 Pandemic (e.g. social distancing) in an effort to limit this patient's exposure and mitigate transmission in our community.  Due to his co-morbid illnesses, this patient is at least at moderate risk for complications without adequate follow up.  This format is felt to be most appropriate for this patient at this time.  The patient did not have access to video technology/had technical difficulties with video requiring transitioning to audio format only (telephone).  All issues noted in this document were discussed and addressed.  No physical exam could be performed with this format.  Evaluation Performed:  Follow-up visit  Date:  06/14/2022   ID:  Joshua Fuller, DOB Aug 01, 1946, MRN [id number]  Patient Location: Home Provider Location: Office/Clinic  Participants: Patient Location of Patient: Home Location of Provider: Telehealth Consent was obtain for visit to be over via telehealth. I verified that I am speaking with the correct person using two identifiers.  PCP:  Lindell Spar, MD   Chief Complaint: Cough, nasal congestion and rhinorrhea  History of Present Illness:    Joshua Fuller is a 76 y.o. male with PMH of COPD, depression, OCD, SCC of skin, erectile dysfunction and iron deficiency anemia who has a televisit for follow up of his chronic medical conditions and c/o cough, nasal congestion and runny nose for the last 2 days.  He denies any fever, chills, dyspnea or wheezing.  He reports that his grandson was having URTI symptoms in the last week, but has not shared whether he was COVID positive or not.  The patient does have symptoms concerning for COVID-19 infection (fever, chills, cough, or new shortness of breath).   Past Medical, Surgical, Social History, Allergies, and Medications have been Reviewed.  Past Medical History:   Diagnosis Date   Allergy    penicillin   Anemia    Phreesia 03/09/2020   Anxiety    Aortic atherosclerosis (HCC)    Arthritis    Some on right wrist   Blood transfusion without reported diagnosis    Dr. Luan Pulling ordered blood transfusions years ago   Cancer [facility])    Skin cancer   Chronic mental illness    OCD   COPD (chronic obstructive pulmonary disease) ([place])    That's what I'm told   Depression    Phreesia [id number]/[id number]   Helicobacter pylori gastritis 08/29/2013   TOOK PYLERA    History of MRSA infection    of the skin   Hypertension    Phreesia 03/09/2020   IDA (iron deficiency anemia)    Ileitis 11/06/2013   Neurofibroma of abdominal wall    Past Surgical History:  Procedure Laterality Date   ANKLE SURGERY     APPENDECTOMY N/A    Phreesia 03/09/2020   Capsule endoscopy  02/19/2007   MWU:XLKGMWN view of gastric mucosa/Normal small bowel mucosa   COLONOSCOPY  02/02/2007   SLF:Two 4-mm to 6-mm rectal polyps/sigmoid colon diverticulosis and internal hemorrhoids. hyperplastic polyps.    COLONOSCOPY N/A 03/22/2013   SLF: ANEMIA MOST LIKELY DUE TO ULCERS IN terminal ileum/Moderate diverticulosis  in the sigmoid colon/ONE RECTAL polyp removed(hyperplastic). ileum bx with focal active ileitis. next TCS 03/2023 with overtube   COLONOSCOPY, ESOPHAGOGASTRODUODENOSCOPY (EGD) AND ESOPHAGEAL DILATION N/A 03/22/2013   UUV:[id number] DUE Stricture at the gastroesophageal junction/Large hiatal hernia/MILD Non-erosive gastritis & DUODENITIS (+h.pylori)   DENTAL SURGERY  02/2020  ESOPHAGOGASTRODUODENOSCOPY  02/02/2007   HUT:MLYYT hiatal hernia closely examined and no evidence of Cameron ulcers.  Otherwise, normal esophagus. 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   ferrous sulfate 325 (65 FE) MG EC tablet  Take 325 mg by mouth daily with breakfast.   lamoTRIgine (LAMICTAL) 100 MG tablet Take 100 mg by mouth daily.    Multiple Vitamin (MULTIVITAMIN) capsule Take 1 capsule by mouth daily.   sildenafil (REVATIO) 20 MG tablet Take 1 tablet (20 mg total) by mouth daily. Take 1-5 tablets as needed   Vilazodone HCl (VIIBRYD) 40 MG TABS Take 20 mg by mouth daily.      Allergies:   Penicillins   ROS:   Please see the history of present illness.     All other systems reviewed and are negative.   Labs/Other Tests and Data Reviewed:    Recent Labs: 12/08/2021: ALT 16; BUN 18; Creatinine, Ser 0.97; Hemoglobin 13.1; Platelets 222; Potassium 5.3; Sodium 140; TSH 1.950   Recent Lipid Panel Lab Results  Component Value Date/Time   CHOL 193 12/08/2021 10:50 AM   TRIG 49 12/08/2021 10:50 AM   HDL 59 12/08/2021 10:50 AM   CHOLHDL 3.3 12/08/2021 10:50 AM   CHOLHDL 3.4 12/19/2019 10:17 AM   LDLCALC 125 (H) 12/08/2021 10:50 AM   LDLCALC 97 12/19/2019 10:17 AM    Wt Readings from Last 3 Encounters:  03/03/22 139 lb 6.4 oz (63.2 kg)  12/08/21 138 lb 6.4 oz (62.8 kg)  06/10/21 136 lb 0.6 oz (61.7 kg)     ASSESSMENT & PLAN:    URTI Suspected COVID-19 infection Check rapid flu and COVID RT-PCR Continue DayQuil NyQuil as needed for nasal congestion and cough Maintain adequate hydration Advised to contact if any new symptoms or persistent symptoms  MDD (major depressive disorder) Follows up with Psychiatry, also reports h/o OCD On Lamictal and Viibryd Needs to follow up with Psychiatry regularly   Time:   Today, I have spent 12 minutes reviewing the chart, including problem list, medications, and with the patient with telehealth technology discussing the above problems.   Medication Adjustments/Labs and Tests Ordered: Current medicines are reviewed at length with the patient today.  Concerns regarding medicines are outlined above.   Tests Ordered: No orders of the defined types were  placed in this encounter.   Medication Changes: No orders of the defined types were placed in this encounter.    Note: This dictation was prepared with Dragon dictation along with smaller phrase technology. Similar sounding words can be transcribed inadequately or may not be corrected upon review. Any transcriptional errors that result from this process are unintentional.      Disposition:  Follow up  Signed, Lindell Spar, MD  06/14/2022 10:24 AM     [facility]

## 2022-06-14 NOTE — Assessment & Plan Note (Signed)
Follows up with Psychiatry, also reports h/o OCD On Lamictal and Viibryd Needs to follow up with Psychiatry regularly 

## 2022-06-14 NOTE — Assessment & Plan Note (Deleted)
Follows up with Psychiatry, also reports h/o OCD On Lamictal and Viibryd Needs to follow up with Psychiatry regularly 

## 2022-06-14 NOTE — Patient Instructions (Signed)
Please come to office for COVID and flu testing.  Okay to take Dayquil/Nyquil for nasal congestion.

## 2022-06-16 LAB — NOVEL CORONAVIRUS, NAA: SARS-CoV-2, NAA: NOT DETECTED

## 2022-07-26 DIAGNOSIS — L570 Actinic keratosis: Secondary | ICD-10-CM | POA: Diagnosis not present

## 2022-07-26 DIAGNOSIS — X32XXXD Exposure to sunlight, subsequent encounter: Secondary | ICD-10-CM | POA: Diagnosis not present

## 2022-08-12 ENCOUNTER — Ambulatory Visit (INDEPENDENT_AMBULATORY_CARE_PROVIDER_SITE_OTHER): Payer: Medicare Other | Admitting: Internal Medicine

## 2022-08-12 ENCOUNTER — Encounter: Payer: Self-pay | Admitting: Internal Medicine

## 2022-08-12 VITALS — BP 148/82 | HR 54 | Ht 68.0 in | Wt 126.8 lb

## 2022-08-12 DIAGNOSIS — I10 Essential (primary) hypertension: Secondary | ICD-10-CM

## 2022-08-12 DIAGNOSIS — Z23 Encounter for immunization: Secondary | ICD-10-CM | POA: Diagnosis not present

## 2022-08-12 DIAGNOSIS — Z720 Tobacco use: Secondary | ICD-10-CM | POA: Diagnosis not present

## 2022-08-12 DIAGNOSIS — I700 Atherosclerosis of aorta: Secondary | ICD-10-CM | POA: Diagnosis not present

## 2022-08-12 DIAGNOSIS — F3340 Major depressive disorder, recurrent, in remission, unspecified: Secondary | ICD-10-CM

## 2022-08-12 DIAGNOSIS — R002 Palpitations: Secondary | ICD-10-CM

## 2022-08-12 MED ORDER — TELMISARTAN 20 MG PO TABS: 20.0000 mg | ORAL_TABLET | Freq: Every day | ORAL | 1 refills | Status: DC

## 2022-08-12 NOTE — Assessment & Plan Note (Addendum)
Intermittent palpitations  EKG: Sinus bradycardia.  HR 49.  PAC noted. No signs of active ischemia.  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

## 2022-08-12 NOTE — Assessment & Plan Note (Signed)
Follows up with Psychiatry, also reports h/o OCD On Lamictal and Viibryd Needs to follow up with Psychiatry regularly

## 2022-08-12 NOTE — Assessment & Plan Note (Signed)
Smokes about 1 pack/day ° °Asked about quitting: confirms that he currently smokes cigarettes °Advise to quit smoking: Educated about QUITTING to reduce the risk of cancer, cardio and cerebrovascular disease. °Assess willingness: Unwilling to quit at this time, but is working on cutting back. °Assist with counseling and pharmacotherapy: Counseled for 5 minutes and literature provided. °Arrange for follow up: Follow up in 3 months and continue to offer help. °

## 2022-08-12 NOTE — Assessment & Plan Note (Signed)
Denies taking statin BP elevated, started telmisartan

## 2022-08-12 NOTE — Patient Instructions (Addendum)
Please start taking Telmisartan as prescribed.  Please follow DASH diet and perform moderate exercise/walking at least 150 mins/week.  Please try to cut down -> quit smoking.

## 2022-08-12 NOTE — Progress Notes (Signed)
Established Patient Office Visit  Subjective:  Patient ID: Joshua Fuller, male    DOB: 11/14/1945  Age: 76 y.o. MRN: [id number]  CC:  Chief Complaint  Patient presents with   Hypertension    HPI Joshua Fuller is a 76 y.o. male with past medical history of COPD, depression, OCD, SCC of skin, erectile dysfunction and iron deficiency anemia who presents for follow up of his chronic medical conditions.  He reports that his blood pressure has been running high at home for the last few days. His BP was 166/89 yesterday, but he denies any headache, dizziness, chest pain or dyspnea.  He reports intermittent palpitations even in resting state.  He has a history of OCD and MDD and takes Viibryd and Lamictal.  He smokes about a pack per day.  Past Medical History:  Diagnosis Date   Allergy    penicillin   Anemia    Phreesia 03/09/2020   Anxiety    Aortic atherosclerosis (HCC)    Arthritis    Some on right wrist   Blood transfusion without reported diagnosis    Dr. Luan Pulling ordered blood transfusions years ago   Cancer [facility])    Skin cancer   Chronic mental illness    OCD   COPD (chronic obstructive pulmonary disease) ([place])    That's what I'm told   Depression    Phreesia 82/95/6213   Helicobacter pylori gastritis 08/29/2013   TOOK PYLERA    History of MRSA infection    of the skin   Hypertension    Phreesia 03/09/2020   IDA (iron deficiency anemia)    Ileitis 11/06/2013   Neurofibroma of abdominal wall     Past Surgical History:  Procedure Laterality Date   ANKLE SURGERY     APPENDECTOMY N/A    Phreesia 03/09/2020   Capsule endoscopy  02/19/2007   YQM:VHQIONG view of gastric mucosa/Normal small bowel mucosa   COLONOSCOPY  02/02/2007   SLF:Two 4-mm to 6-mm rectal polyps/sigmoid colon diverticulosis and internal hemorrhoids. hyperplastic polyps.    COLONOSCOPY N/A 03/22/2013   SLF: ANEMIA MOST LIKELY DUE TO ULCERS IN terminal ileum/Moderate diverticulosis  in  the sigmoid colon/ONE RECTAL polyp removed(hyperplastic). ileum bx with focal active ileitis. next TCS 03/2023 with overtube   COLONOSCOPY, ESOPHAGOGASTRODUODENOSCOPY (EGD) AND ESOPHAGEAL DILATION N/A 03/22/2013   EXB:MWUXLKGMW DUE Stricture at the gastroesophageal junction/Large hiatal hernia/MILD Non-erosive gastritis & DUODENITIS (+h.pylori)   DENTAL SURGERY  02/2020   ESOPHAGOGASTRODUODENOSCOPY  02/02/2007   NUU:VOZDG hiatal hernia closely examined and no evidence of Cameron ulcers.  Otherwise, normal esophagus. 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  Tobacco Use   Smoking status: Some Days    Years: 15.00    Types: Cigarettes    Last attempt to quit: 07/14/2019    Years since quitting: 3.0   Smokeless tobacco: Never  Tobacco comments:    Two cigarettes per day  Vaping Use   Vaping Use: Never used  Substance and Sexual Activity   Alcohol use: Yes    Alcohol/week: 1.0 standard drink of alcohol    Types: 1 Glasses of wine per week    Comment: Couple glasses of wine per month    Drug use: Yes    Types: Marijuana    Comment: Daily as needed   Sexual activity: Not Currently    Birth control/protection: None  Other Topics Concern   Not on file  Social History Narrative   Retired from [employer].    Social Determinants of Health   Financial Resource Strain: Low Risk  (04/13/2022)   Overall Financial Resource Strain (CARDIA)    Difficulty of Paying Living Expenses: Not hard at all  Food Insecurity: No Food  Insecurity (04/13/2022)   Hunger Vital Sign    Worried About Running Out of Food in the Last Year: Never true    Ran Out of Food in the Last Year: Never true  Transportation Needs: No Transportation Needs (04/13/2022)   PRAPARE - Hydrologist (Medical): No    Lack of Transportation (Non-Medical): No  Physical Activity: Inactive (04/13/2022)   Exercise Vital Sign    Days of Exercise per Week: 0 days    Minutes of Exercise per Session: 0 min  Stress: No Stress Concern Present (04/13/2022)   [place]    Feeling of Stress : Not at all  Social Connections: [place] (04/13/2022)   Social Connection and Isolation Panel [NHANES]    Frequency of Communication with Friends and Family: More than three times a week    Frequency of Social Gatherings with Friends and Family: Three times a week    Attends Religious Services: More than 4 times per year    Active Member of Clubs or Organizations: No    Attends Archivist Meetings: More than 4 times per year    Marital Status: Married  Human resources officer Violence: Not At Risk (04/13/2022)   Humiliation, Afraid, Rape, and Kick questionnaire    Fear of Current or Ex-Partner: No    Emotionally Abused: No    Physically Abused: No    Sexually Abused: No    Outpatient Medications Prior to Visit  Medication Sig Dispense Refill   Cholecalciferol (VITAMIN D3 PO) Take 1 tablet by mouth daily.     ferrous sulfate 325 (65 FE) MG EC tablet Take 325 mg by mouth daily with breakfast.     lamoTRIgine (LAMICTAL) 100 MG tablet Take 100 mg by mouth daily.      Multiple Vitamin (MULTIVITAMIN) capsule Take 1 capsule by mouth daily.     sildenafil (REVATIO) 20 MG tablet Take 1 tablet (20 mg total) by mouth daily. Take 1-5 tablets as needed 90 tablet 3   Vilazodone HCl (VIIBRYD) 40 MG TABS Take 20 mg by mouth daily.      No facility-administered medications prior to  visit.    Allergies  Allergen Reactions   Penicillins Rash    Has patient had a PCN reaction causing immediate rash, facial/tongue/throat swelling, SOB or lightheadedness with hypotension: yes Has patient had a PCN reaction causing severe rash involving mucus membranes or skin necrosis: no Has patient had a PCN reaction that required hospitalization: no Has patient had a PCN reaction occurring within the last 10 years: no If all of the above answers  are "NO", then may proceed with Cephalosporin use.     ROS Review of Systems  Constitutional:  Negative for chills and fever.  HENT:  Negative for congestion and sore throat.   Eyes:  Negative for pain and discharge.  Respiratory:  Negative for cough and shortness of breath.   Cardiovascular:  Positive for palpitations. Negative for chest pain.  Gastrointestinal:  Negative for diarrhea, nausea and vomiting.  Endocrine: Negative for polydipsia and polyuria.  Genitourinary:  Negative for dysuria and hematuria.  Musculoskeletal:  Positive for arthralgias (Left hand with swelling) and back pain. Negative for neck pain and neck stiffness.  Skin:  Negative for rash.  Neurological:  Negative for dizziness, weakness, numbness and headaches.  Psychiatric/Behavioral:  Positive for sleep disturbance. Negative for agitation, behavioral problems, hallucinations and suicidal ideas. The patient is nervous/anxious.       Objective:    Physical Exam Vitals reviewed.  Constitutional:      General: He is not in acute distress.    Appearance: He is not diaphoretic.  HENT:     Head: Normocephalic and atraumatic.     Nose: Nose normal.     Mouth/Throat:     Mouth: Mucous membranes are moist.  Eyes:     General: No scleral icterus.    Extraocular Movements: Extraocular movements intact.  Cardiovascular:     Rate and Rhythm: Normal rate and regular rhythm.     Pulses: Normal pulses.     Heart sounds: Normal heart sounds. No murmur  heard. Pulmonary:     Breath sounds: Normal breath sounds. No wheezing or rales.  Musculoskeletal:        General: Swelling (MCP, PIP and DIP joints of left hand - Mild) present.     Cervical back: Neck supple. No tenderness.     Right lower leg: No edema.     Left lower leg: No edema.  Skin:    General: Skin is warm.     Findings: No rash.     Comments: Erythematous papule over scalp  Neurological:     General: No focal deficit present.     Mental Status: He is alert and oriented to person, place, and time.     Sensory: No sensory deficit.     Motor: No weakness.  Psychiatric:        Mood and Affect: Mood normal.        Behavior: Behavior normal.     BP (!) 148/82 (BP Location: Left Arm, Cuff Size: Normal)   Pulse (!) 54   Ht _0  (1.727 m)   Wt 126 lb 12.8 oz (57.5 kg)   SpO2 96%   BMI 19.28 kg/m  Wt Readings from Last 3 Encounters:  08/12/22 126 lb 12.8 oz (57.5 kg)  03/03/22 139 lb 6.4 oz (63.2 kg)  12/08/21 138 lb 6.4 oz (62.8 kg)    Lab Results  Component Value Date   TSH 1.950 12/08/2021   Lab Results  Component Value Date   WBC 5.7 12/08/2021   HGB 13.1 12/08/2021   HCT 38.9 12/08/2021   MCV 99 (H) 12/08/2021   PLT 222 12/08/2021   Lab Results  Component Value Date   NA 140 12/08/2021   K 5.3 (H) 12/08/2021   CO2 27 12/08/2021   GLUCOSE 86 12/08/2021   BUN 18 12/08/2021   CREATININE 0.97 12/08/2021   BILITOT 0.6 12/08/2021   ALKPHOS 75 12/08/2021   AST 20 12/08/2021   ALT 16 12/08/2021  PROT 6.8 12/08/2021   ALBUMIN 4.8 (H) 12/08/2021   CALCIUM 9.4 12/08/2021   EGFR 81 12/08/2021   Lab Results  Component Value Date   CHOL 193 12/08/2021   Lab Results  Component Value Date   HDL 59 12/08/2021   Lab Results  Component Value Date   LDLCALC 125 (H) 12/08/2021   Lab Results  Component Value Date   TRIG 49 12/08/2021   Lab Results  Component Value Date   CHOLHDL 3.3 12/08/2021   Lab Results  Component Value Date   HGBA1C 5.3  12/08/2021      Assessment & Plan:   Problem List Items Addressed This Visit       Cardiovascular and Mediastinum   Essential hypertension - Primary    BP Readings from Last 1 Encounters:  08/12/22 (!) 148/82  New onset Started telmisartan 20 mg QD Needs to quit smoking Counseled for compliance with the medications Advised DASH diet and moderate exercise/walking, at least 150 mins/week       Relevant Medications   telmisartan (MICARDIS) 20 MG tablet   Aortic atherosclerosis (HCC)    Denies taking statin BP elevated, started telmisartan      Relevant Medications   telmisartan (MICARDIS) 20 MG tablet     Other   MDD (major depressive disorder)    Follows up with Psychiatry, also reports h/o OCD On Lamictal and Viibryd Needs to follow up with Psychiatry regularly      Tobacco abuse    Smokes about 1 pack/day  Asked about quitting: confirms that he currently smokes cigarettes Advise to quit smoking: Educated about QUITTING to reduce the risk of cancer, cardio and cerebrovascular disease. Assess willingness: Unwilling to quit at this time, but is working on cutting back. Assist with counseling and pharmacotherapy: Counseled for 5 minutes and literature provided. Arrange for follow up: Follow up in 3 months and continue to offer help.      Palpitations    Intermittent palpitations  EKG: Sinus bradycardia.  HR 49.  PAC noted. No signs of active ischemia.  Has longstanding history of low HR, was in [employer] and athletics      Relevant Orders   EKG 12-Lead (Completed)   Other Visit Diagnoses     Need for immunization against influenza       Relevant Orders   Flu Vaccine QUAD High Dose(Fluad) (Completed)       Meds ordered this encounter  Medications   telmisartan (MICARDIS) 20 MG tablet    Sig: Take 1 tablet (20 mg total) by mouth daily.    Dispense:  30 tablet    Refill:  1    Follow-up: Return in about 4 weeks (around 09/09/2022) for HTN.     Lindell Spar, MD

## 2022-08-12 NOTE — Assessment & Plan Note (Signed)
BP Readings from Last 1 Encounters:  08/12/22 (!) 148/82   New onset Started telmisartan 20 mg QD Needs to quit smoking Counseled for compliance with the medications Advised DASH diet and moderate exercise/walking, at least 150 mins/week

## 2022-08-15 ENCOUNTER — Other Ambulatory Visit: Payer: Self-pay

## 2022-08-15 ENCOUNTER — Telehealth: Payer: Self-pay | Admitting: Internal Medicine

## 2022-08-15 DIAGNOSIS — I10 Essential (primary) hypertension: Secondary | ICD-10-CM

## 2022-08-15 MED ORDER — TELMISARTAN 20 MG PO TABS: 20.0000 mg | ORAL_TABLET | Freq: Every day | ORAL | 1 refills | Status: DC

## 2022-08-15 NOTE — Telephone Encounter (Signed)
Refill sent.

## 2022-08-15 NOTE — Telephone Encounter (Signed)
Patient came by the office and his pharmacy does not have the telmisartan (MICARDIS) 20 MG tablet [[id number]]   Can this be sent into [facility].

## 2022-09-13 ENCOUNTER — Ambulatory Visit (INDEPENDENT_AMBULATORY_CARE_PROVIDER_SITE_OTHER): Payer: Medicare Other | Admitting: Internal Medicine

## 2022-09-13 ENCOUNTER — Encounter: Payer: Self-pay | Admitting: Internal Medicine

## 2022-09-13 VITALS — BP 139/76 | HR 54 | Ht 68.0 in | Wt 138.4 lb

## 2022-09-13 DIAGNOSIS — R7303 Prediabetes: Secondary | ICD-10-CM | POA: Insufficient documentation

## 2022-09-13 DIAGNOSIS — F3340 Major depressive disorder, recurrent, in remission, unspecified: Secondary | ICD-10-CM | POA: Diagnosis not present

## 2022-09-13 DIAGNOSIS — I700 Atherosclerosis of aorta: Secondary | ICD-10-CM

## 2022-09-13 DIAGNOSIS — J439 Emphysema, unspecified: Secondary | ICD-10-CM

## 2022-09-13 DIAGNOSIS — E559 Vitamin D deficiency, unspecified: Secondary | ICD-10-CM | POA: Diagnosis not present

## 2022-09-13 DIAGNOSIS — F17218 Nicotine dependence, cigarettes, with other nicotine-induced disorders: Secondary | ICD-10-CM

## 2022-09-13 DIAGNOSIS — I10 Essential (primary) hypertension: Secondary | ICD-10-CM

## 2022-09-13 DIAGNOSIS — R972 Elevated prostate specific antigen [PSA]: Secondary | ICD-10-CM | POA: Diagnosis not present

## 2022-09-13 MED ORDER — TELMISARTAN 20 MG PO TABS: 20.0000 mg | ORAL_TABLET | Freq: Every day | ORAL | 1 refills | Status: DC

## 2022-09-13 NOTE — Progress Notes (Signed)
Established Patient Office Visit  Subjective:  Patient ID: Joshua Fuller, male    DOB: 09-Nov-1945  Age: 77 y.o. MRN: [id number]  CC:  Chief Complaint  Patient presents with   Follow-up    For hypertension     HPI Joshua Fuller is a 77 y.o. male with past medical history of COPD, depression, OCD, SCC of skin, erectile dysfunction and iron deficiency anemia who presents for follow up of his chronic medical conditions.  He reports that his blood pressure has been still running high at home, but while smoking. His BP is wnl today. He denies any headache, dizziness, chest pain or dyspnea.  He reports intermittent palpitations even in resting state.  He has a history of OCD and MDD and takes Viibryd and Lamictal.  He smokes about a pack per day.    Past Medical History:  Diagnosis Date   Allergy    penicillin   Anemia    Phreesia 03/09/2020   Anxiety    Aortic atherosclerosis (HCC)    Arthritis    Some on right wrist   Blood transfusion without reported diagnosis    Dr. Luan Pulling ordered blood transfusions years ago   Cancer [facility])    Skin cancer   Chronic mental illness    OCD   COPD (chronic obstructive pulmonary disease) ([place])    That's what I'm told   Depression    Phreesia 83/72/9021   Helicobacter pylori gastritis 08/29/2013   TOOK PYLERA    History of MRSA infection    of the skin   Hypertension    Phreesia 03/09/2020   IDA (iron deficiency anemia)    Ileitis 11/06/2013   Neurofibroma of abdominal wall     Past Surgical History:  Procedure Laterality Date   ANKLE SURGERY     APPENDECTOMY N/A    Phreesia 03/09/2020   Capsule endoscopy  02/19/2007   [id number] view of gastric mucosa/Normal small bowel mucosa   COLONOSCOPY  02/02/2007   SLF:Two 4-mm to 6-mm rectal polyps/sigmoid colon diverticulosis and internal hemorrhoids. hyperplastic polyps.    COLONOSCOPY N/A 03/22/2013   SLF: ANEMIA MOST LIKELY DUE TO ULCERS IN terminal ileum/Moderate  diverticulosis  in the sigmoid colon/ONE RECTAL polyp removed(hyperplastic). ileum bx with focal active ileitis. next TCS 03/2023 with overtube   COLONOSCOPY, ESOPHAGOGASTRODUODENOSCOPY (EGD) AND ESOPHAGEAL DILATION N/A 03/22/2013   [id number] DUE Stricture at the gastroesophageal junction/Large hiatal hernia/MILD Non-erosive gastritis & DUODENITIS (+h.pylori)   DENTAL SURGERY  02/2020   ESOPHAGOGASTRODUODENOSCOPY  02/02/2007   YYF:[id number] hiatal hernia closely examined and no evidence of Cameron ulcers.  Otherwise, normal esophagus. 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    Social Determinants of Health   Financial Resource Strain: Low Risk  (04/13/2022)   Overall Financial Resource Strain (CARDIA)    Difficulty of Paying Living Expenses: Not hard at all  Food Insecurity: No Food Insecurity (04/13/2022)   Hunger Vital Sign     Worried About Running Out of Food in the Last Year: Never true    Ran Out of Food in the Last Year: Never true  Transportation Needs: No Transportation Needs (04/13/2022)   PRAPARE - Hydrologist (Medical): No    Lack of Transportation (Non-Medical): No  Physical Activity: Inactive (04/13/2022)   Exercise Vital Sign    Days of Exercise per Week: 0 days    Minutes of Exercise per Session: 0 min  Stress: No Stress Concern Present (04/13/2022)   [place]    Feeling of Stress : Not at all  Social Connections: [place] (04/13/2022)   Social Connection and Isolation Panel [NHANES]    Frequency of Communication with Friends and Family: More than three times a week    Frequency of Social Gatherings with Friends and Family: Three times a week    Attends Religious Services: More than 4 times per year    Active Member of Clubs or Organizations: No    Attends Archivist Meetings: More than 4 times per year    Marital Status: Married  Human resources officer Violence: Not At Risk (04/13/2022)   Humiliation, Afraid, Rape, and Kick questionnaire    Fear of Current or Ex-Partner: No    Emotionally Abused: No    Physically Abused: No    Sexually Abused: No    Outpatient Medications Prior to Visit  Medication Sig Dispense Refill   Cholecalciferol (VITAMIN D3 PO) Take 1 tablet by mouth daily.     ferrous sulfate 325 (65 FE) MG EC tablet Take 325 mg by mouth daily with breakfast.     lamoTRIgine (LAMICTAL) 100 MG tablet Take 100 mg by mouth daily.      Multiple Vitamin (MULTIVITAMIN) capsule Take 1 capsule by mouth daily.     sildenafil (REVATIO) 20 MG tablet Take 1 tablet (20 mg total) by mouth daily. Take 1-5 tablets as needed 90 tablet 3   Vilazodone HCl (VIIBRYD) 40 MG TABS Take 20 mg by mouth daily.      telmisartan (MICARDIS) 20 MG tablet Take 1 tablet (20 mg total) by mouth daily. 30 tablet 1    No facility-administered medications prior to visit.    Allergies  Allergen Reactions   Penicillins Rash    Has patient had a PCN reaction causing immediate rash, facial/tongue/throat swelling, SOB or lightheadedness with hypotension: yes Has patient had a PCN reaction causing severe rash involving mucus membranes or skin necrosis: no Has patient had a PCN reaction that required hospitalization: no Has patient had a PCN reaction occurring within  the last 10 years: no If all of the above answers are "NO", then may proceed with Cephalosporin use.     ROS Review of Systems  Constitutional:  Negative for chills and fever.  HENT:  Negative for congestion and sore throat.   Eyes:  Negative for pain and discharge.  Respiratory:  Negative for cough and shortness of breath.   Cardiovascular:  Positive for palpitations. Negative for chest pain.  Gastrointestinal:  Negative for diarrhea, nausea and vomiting.  Endocrine: Negative for polydipsia and polyuria.  Genitourinary:  Negative for dysuria and hematuria.  Musculoskeletal:  Positive for arthralgias (Left hand with swelling) and back pain. Negative for neck pain and neck stiffness.  Skin:  Negative for rash.  Neurological:  Negative for dizziness, weakness, numbness and headaches.  Psychiatric/Behavioral:  Positive for sleep disturbance. Negative for agitation, behavioral problems, hallucinations and suicidal ideas. The patient is nervous/anxious.       Objective:    Physical Exam Vitals reviewed.  Constitutional:      General: He is not in acute distress.    Appearance: He is not diaphoretic.  HENT:     Head: Normocephalic and atraumatic.     Nose: Nose normal.     Mouth/Throat:     Mouth: Mucous membranes are moist.  Eyes:     General: No scleral icterus.    Extraocular Movements: Extraocular movements intact.  Cardiovascular:     Rate and Rhythm: Normal rate and regular rhythm.     Pulses: Normal pulses.     Heart  sounds: Normal heart sounds. No murmur heard. Pulmonary:     Breath sounds: Normal breath sounds. No wheezing or rales.  Musculoskeletal:        General: Swelling (MCP, PIP and DIP joints of left hand - Mild) present.     Cervical back: Neck supple. No tenderness.     Right lower leg: No edema.     Left lower leg: No edema.  Skin:    General: Skin is warm.     Findings: No rash.     Comments: Erythematous papule over scalp  Neurological:     General: No focal deficit present.     Mental Status: He is alert and oriented to person, place, and time.     Sensory: No sensory deficit.     Motor: No weakness.  Psychiatric:        Mood and Affect: Mood normal.        Behavior: Behavior normal.     BP 139/76 (BP Location: Left Arm, Patient Position: Sitting, Cuff Size: Normal)   Pulse (!) 54   Ht _0  (1.727 m)   Wt 138 lb 6.4 oz (62.8 kg)   SpO2 98%   BMI 21.04 kg/m  Wt Readings from Last 3 Encounters:  09/13/22 138 lb 6.4 oz (62.8 kg)  08/12/22 126 lb 12.8 oz (57.5 kg)  03/03/22 139 lb 6.4 oz (63.2 kg)    Lab Results  Component Value Date   TSH 1.950 12/08/2021   Lab Results  Component Value Date   WBC 5.7 12/08/2021   HGB 13.1 12/08/2021   HCT 38.9 12/08/2021   MCV 99 (H) 12/08/2021   PLT 222 12/08/2021   Lab Results  Component Value Date   NA 140 12/08/2021   K 5.3 (H) 12/08/2021   CO2 27 12/08/2021   GLUCOSE 86 12/08/2021   BUN 18 12/08/2021   CREATININE 0.97 12/08/2021   BILITOT 0.6 12/08/2021   ALKPHOS  75 12/08/2021   AST 20 12/08/2021   ALT 16 12/08/2021   PROT 6.8 12/08/2021   ALBUMIN 4.8 (H) 12/08/2021   CALCIUM 9.4 12/08/2021   EGFR 81 12/08/2021   Lab Results  Component Value Date   CHOL 193 12/08/2021   Lab Results  Component Value Date   HDL 59 12/08/2021   Lab Results  Component Value Date   LDLCALC 125 (H) 12/08/2021   Lab Results  Component Value Date   TRIG 49 12/08/2021   Lab Results  Component Value Date   CHOLHDL 3.3  12/08/2021   Lab Results  Component Value Date   HGBA1C 5.3 12/08/2021      Assessment & Plan:   Problem List Items Addressed This Visit       Cardiovascular and Mediastinum   Essential hypertension - Primary    BP Readings from Last 1 Encounters:  09/13/22 139/76  New onset Well-controlled with telmisartan 20 mg QD Needs to quit smoking Counseled for compliance with the medications Advised DASH diet and moderate exercise/walking, at least 150 mins/week      Relevant Medications   telmisartan (MICARDIS) 20 MG tablet   Other Relevant Orders   TSH   CMP14+EGFR   CBC with Differential/Platelet   Aortic atherosclerosis ([place])    Denies taking statin BP wnl now      Relevant Medications   telmisartan (MICARDIS) 20 MG tablet   Other Relevant Orders   Lipid panel     Respiratory   Emphysema of lung (HCC)    No breathing problems currently Emphysema noted on CT chest        Other   MDD (major depressive disorder)    Follows up with Psychiatry, also reports h/o OCD On Lamictal and Viibryd Needs to follow up with Psychiatry regularly      Relevant Orders   TSH   CMP14+EGFR   Nicotine dependence    Smokes about 1 pack/day  Asked about quitting: confirms that he currently smokes cigarettes Advise to quit smoking: Educated about QUITTING to reduce the risk of cancer, cardio and cerebrovascular disease. Assess willingness: Unwilling to quit at this time, but is working on cutting back. Assist with counseling and pharmacotherapy: Counseled for 5 minutes and literature provided. Arrange for follow up: Follow up in 3 months and continue to offer help.  Ordered low dose CT chest for lung cancer screening.      Relevant Orders   CT CHEST LUNG CANCER SCREENING LOW DOSE WO CONTRAST   Elevated PSA    Had referred to Urology PSA stable Will recheck PSA      Relevant Orders   PSA   Prediabetes    Lab Results  Component Value Date   HGBA1C 5.3 12/08/2021   Advised to follow DASH diet for now      Relevant Orders   Hemoglobin A1c   Other Visit Diagnoses     Vitamin D deficiency       Relevant Orders   VITAMIN D 25 Hydroxy (Vit-D Deficiency, Fractures)       Meds ordered this encounter  Medications   telmisartan (MICARDIS) 20 MG tablet    Sig: Take 1 tablet (20 mg total) by mouth daily.    Dispense:  90 tablet    Refill:  1    Follow-up: Return in about 6 months (around 03/14/2023).    Lindell Spar, MD

## 2022-09-13 NOTE — Assessment & Plan Note (Addendum)
Smokes about 1 pack/day  Asked about quitting: confirms that he currently smokes cigarettes Advise to quit smoking: Educated about QUITTING to reduce the risk of cancer, cardio and cerebrovascular disease. Assess willingness: Unwilling to quit at this time, but is working on cutting back. Assist with counseling and pharmacotherapy: Counseled for 5 minutes and literature provided. Arrange for follow up: Follow up in 3 months and continue to offer help.  Ordered low dose CT chest for lung cancer screening.

## 2022-09-13 NOTE — Assessment & Plan Note (Signed)
Denies taking statin BP wnl now

## 2022-09-13 NOTE — Assessment & Plan Note (Signed)
Follows up with Psychiatry, also reports h/o OCD On Lamictal and Viibryd Needs to follow up with Psychiatry regularly

## 2022-09-13 NOTE — Assessment & Plan Note (Signed)
No breathing problems currently Emphysema noted on CT chest

## 2022-09-13 NOTE — Assessment & Plan Note (Addendum)
Had referred to Urology PSA stable Will recheck PSA

## 2022-09-13 NOTE — Assessment & Plan Note (Signed)
Lab Results  Component Value Date   HGBA1C 5.3 12/08/2021   Advised to follow DASH diet for now

## 2022-09-13 NOTE — Patient Instructions (Signed)
Please continue taking medications as prescribed.  Please continue to follow low salt diet and ambulate as tolerated. 

## 2022-09-13 NOTE — Assessment & Plan Note (Addendum)
BP Readings from Last 1 Encounters:  09/13/22 139/76   New onset Well-controlled with telmisartan 20 mg QD Needs to quit smoking Counseled for compliance with the medications Advised DASH diet and moderate exercise/walking, at least 150 mins/week

## 2022-09-14 DIAGNOSIS — R972 Elevated prostate specific antigen [PSA]: Secondary | ICD-10-CM | POA: Diagnosis not present

## 2022-09-14 DIAGNOSIS — R7303 Prediabetes: Secondary | ICD-10-CM | POA: Diagnosis not present

## 2022-09-14 DIAGNOSIS — I10 Essential (primary) hypertension: Secondary | ICD-10-CM | POA: Diagnosis not present

## 2022-09-14 DIAGNOSIS — I700 Atherosclerosis of aorta: Secondary | ICD-10-CM | POA: Diagnosis not present

## 2022-09-14 DIAGNOSIS — F3340 Major depressive disorder, recurrent, in remission, unspecified: Secondary | ICD-10-CM | POA: Diagnosis not present

## 2022-09-14 DIAGNOSIS — E559 Vitamin D deficiency, unspecified: Secondary | ICD-10-CM | POA: Diagnosis not present

## 2022-09-16 LAB — CBC WITH DIFFERENTIAL/PLATELET
Basophils Absolute: 0.1 10*3/uL (ref 0.0–0.2)
Basos: 1 %
EOS (ABSOLUTE): 0.1 10*3/uL (ref 0.0–0.4)
Eos: 1 %
Hematocrit: 35.7 % — ABNORMAL LOW (ref 37.5–51.0)
Hemoglobin: 12.6 g/dL — ABNORMAL LOW (ref 13.0–17.7)
Immature Grans (Abs): 0 10*3/uL (ref 0.0–0.1)
Immature Granulocytes: 1 %
Lymphocytes Absolute: 1.8 10*3/uL (ref 0.7–3.1)
Lymphs: 26 %
MCH: 34.6 pg — ABNORMAL HIGH (ref 26.6–33.0)
MCHC: 35.3 g/dL (ref 31.5–35.7)
MCV: 98 fL — ABNORMAL HIGH (ref 79–97)
Monocytes Absolute: 0.5 10*3/uL (ref 0.1–0.9)
Monocytes: 7 %
Neutrophils Absolute: 4.3 10*3/uL (ref 1.4–7.0)
Neutrophils: 64 %
Platelets: 211 10*3/uL (ref 150–450)
RBC: 3.64 x10E6/uL — ABNORMAL LOW (ref 4.14–5.80)
RDW: 14.1 % (ref 11.6–15.4)
WBC: 6.7 10*3/uL (ref 3.4–10.8)

## 2022-09-16 LAB — CMP14+EGFR
ALT: 17 IU/L (ref 0–44)
AST: 20 IU/L (ref 0–40)
Albumin/Globulin Ratio: 2.2 (ref 1.2–2.2)
Albumin: 4.7 g/dL (ref 3.8–4.8)
Alkaline Phosphatase: 77 IU/L (ref 44–121)
BUN/Creatinine Ratio: 19 (ref 10–24)
BUN: 19 mg/dL (ref 8–27)
Bilirubin Total: 0.3 mg/dL (ref 0.0–1.2)
CO2: 23 mmol/L (ref 20–29)
Calcium: 9.1 mg/dL (ref 8.6–10.2)
Chloride: 102 mmol/L (ref 96–106)
Creatinine, Ser: 0.99 mg/dL (ref 0.76–1.27)
Globulin, Total: 2.1 g/dL (ref 1.5–4.5)
Glucose: 97 mg/dL (ref 70–99)
Potassium: 4.4 mmol/L (ref 3.5–5.2)
Sodium: 141 mmol/L (ref 134–144)
Total Protein: 6.8 g/dL (ref 6.0–8.5)
eGFR: 79 mL/min/{1.73_m2} (ref >59)

## 2022-09-16 LAB — VITAMIN D 25 HYDROXY (VIT D DEFICIENCY, FRACTURES): Vit D, 25-Hydroxy: 46.6 ng/mL (ref 30.0–100.0)

## 2022-09-16 LAB — HEMOGLOBIN A1C
Est. average glucose Bld gHb Est-mCnc: 108 mg/dL
Hgb A1c MFr Bld: 5.4 % (ref 4.8–5.6)

## 2022-09-16 LAB — LIPID PANEL
Chol/HDL Ratio: 3.5 ratio (ref 0.0–5.0)
Cholesterol, Total: 187 mg/dL (ref 100–199)
HDL: 53 mg/dL (ref >39)
LDL Chol Calc (NIH): 123 mg/dL — ABNORMAL HIGH (ref 0–99)
Triglycerides: 57 mg/dL (ref 0–149)
VLDL Cholesterol Cal: 11 mg/dL (ref 5–40)

## 2022-09-16 LAB — PSA: Prostate Specific Ag, Serum: 3.1 ng/mL (ref 0.0–4.0)

## 2022-09-16 LAB — TSH: TSH: 2.27 u[IU]/mL (ref 0.450–4.500)

## 2022-09-20 ENCOUNTER — Ambulatory Visit (HOSPITAL_COMMUNITY)
Admission: RE | Admit: 2022-09-20 | Discharge: 2022-09-20 | Disposition: A | Discharge status: Home / Self Care | Payer: Medicare Other | Source: Ambulatory Visit | Attending: Internal Medicine | Admitting: Internal Medicine

## 2022-09-20 DIAGNOSIS — F17210 Nicotine dependence, cigarettes, uncomplicated: Secondary | ICD-10-CM | POA: Diagnosis not present

## 2022-09-20 DIAGNOSIS — F17218 Nicotine dependence, cigarettes, with other nicotine-induced disorders: Secondary | ICD-10-CM | POA: Insufficient documentation

## 2022-09-21 ENCOUNTER — Encounter: Payer: Self-pay | Admitting: Internal Medicine

## 2022-09-21 DIAGNOSIS — I7121 Aneurysm of the ascending aorta, without rupture: Secondary | ICD-10-CM | POA: Insufficient documentation

## 2022-09-21 NOTE — Progress Notes (Signed)
Returned call at 2:13pm

## 2022-12-14 ENCOUNTER — Encounter: Payer: Self-pay | Admitting: Internal Medicine

## 2022-12-14 ENCOUNTER — Ambulatory Visit (INDEPENDENT_AMBULATORY_CARE_PROVIDER_SITE_OTHER): Payer: Medicare Other | Admitting: Internal Medicine

## 2022-12-14 VITALS — BP 138/89 | HR 56 | Ht 68.0 in | Wt 137.8 lb

## 2022-12-14 DIAGNOSIS — I700 Atherosclerosis of aorta: Secondary | ICD-10-CM | POA: Diagnosis not present

## 2022-12-14 DIAGNOSIS — R002 Palpitations: Secondary | ICD-10-CM | POA: Diagnosis not present

## 2022-12-14 DIAGNOSIS — F3340 Major depressive disorder, recurrent, in remission, unspecified: Secondary | ICD-10-CM | POA: Diagnosis not present

## 2022-12-14 DIAGNOSIS — D500 Iron deficiency anemia secondary to blood loss (chronic): Secondary | ICD-10-CM | POA: Diagnosis not present

## 2022-12-14 DIAGNOSIS — I10 Essential (primary) hypertension: Secondary | ICD-10-CM | POA: Diagnosis not present

## 2022-12-14 NOTE — Progress Notes (Unsigned)
Established Patient Office Visit  Subjective:  Patient ID: Joshua Fuller, male    DOB: March 01, 1946  Age: 77 y.o. MRN: [id number]  CC:  Chief Complaint  Patient presents with   Follow-up    HPI Joshua Fuller is a 77 y.o. male with past medical history of COPD, depression, OCD, SCC of skin, erectile dysfunction and iron deficiency anemia who presents for follow up of his chronic medical conditions.  He reports that his blood pressure has been still running high at home, but while smoking. His BP is wnl today. He denies any headache, dizziness, chest pain or dyspnea.  He reports intermittent palpitations even in resting state.  He has a history of OCD and MDD and takes Viibryd and Lamictal.  He smokes about a pack per day.    Past Medical History:  Diagnosis Date   Allergy    penicillin   Anemia    Phreesia 03/09/2020   Anxiety    Aortic atherosclerosis    Arthritis    Some on right wrist   Blood transfusion without reported diagnosis    Dr. Luan Pulling ordered blood transfusions years ago   Cancer    Skin cancer   Chronic mental illness    OCD   COPD (chronic obstructive pulmonary disease)    That's what I'm told   Depression    Phreesia [id number]   Helicobacter pylori gastritis 08/29/2013   TOOK PYLERA    History of MRSA infection    of the skin   Hypertension    Phreesia 03/09/2020   IDA (iron deficiency anemia)    Ileitis 11/06/2013   Neurofibroma of abdominal wall     Past Surgical History:  Procedure Laterality Date   ANKLE SURGERY     APPENDECTOMY N/A    Phreesia 03/09/2020   Capsule endoscopy  02/19/2007   [id number] view of gastric mucosa/Normal small bowel mucosa   COLONOSCOPY  02/02/2007   SLF:Two 4-mm to 6-mm rectal polyps/sigmoid colon diverticulosis and internal hemorrhoids. hyperplastic polyps.    COLONOSCOPY N/A 03/22/2013   SLF: ANEMIA MOST LIKELY DUE TO ULCERS IN terminal ileum/Moderate diverticulosis  in the sigmoid colon/ONE RECTAL  polyp removed(hyperplastic). ileum bx with focal active ileitis. next TCS 03/2023 with overtube   COLONOSCOPY, ESOPHAGOGASTRODUODENOSCOPY (EGD) AND ESOPHAGEAL DILATION N/A 03/22/2013   [id number] DUE Stricture at the gastroesophageal junction/Large hiatal hernia/MILD Non-erosive gastritis & DUODENITIS (+h.pylori)   DENTAL SURGERY  02/2020   ESOPHAGOGASTRODUODENOSCOPY  02/02/2007   [id number] hiatal hernia closely examined and no evidence of Cameron ulcers.  Otherwise, normal esophagus. 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  Tobacco Use   Smoking status: Every Day    Packs/day: 1.00    Years: 20.00    Additional pack years: 0.00    Total pack years: 20.00    Types: Cigarettes   Smokeless tobacco: Never  Vaping Use   Vaping Use: Never used  Substance and Sexual Activity   Alcohol use: Yes    Alcohol/week: 1.0 standard drink of alcohol    Types: 1 Glasses of wine per week    Comment: Couple glasses of wine per month    Drug use: Yes    Types: Marijuana    Comment: Daily as needed   Sexual activity: Not Currently    Birth control/protection: None  Other Topics Concern   Not on file  Social History Narrative   Retired from [employer].    Social Determinants of Health   Financial Resource Strain: Low Risk  (04/13/2022)   Overall Financial Resource Strain (CARDIA)    Difficulty of Paying Living Expenses: Not hard at all  Food Insecurity: No Food Insecurity (04/13/2022)   Hunger Vital Sign    Worried About Running  Out of Food in the Last Year: Never true    Ran Out of Food in the Last Year: Never true  Transportation Needs: No Transportation Needs (04/13/2022)   PRAPARE - Hydrologist (Medical): No    Lack of Transportation (Non-Medical): No  Physical Activity: Inactive (04/13/2022)   Exercise Vital Sign    Days of Exercise per Week: 0 days    Minutes of Exercise per Session: 0 min  Stress: No Stress Concern Present (04/13/2022)   [place]    Feeling of Stress : Not at all  Social Connections: [place] (04/13/2022)   Social Connection and Isolation Panel [NHANES]    Frequency of Communication with Friends and Family: More than three times a week    Frequency of Social Gatherings with Friends and Family: Three times a week    Attends Religious Services: More than 4 times per year    Active Member of Clubs or Organizations: No    Attends Archivist Meetings: More than 4 times per year    Marital Status: Married  Human resources officer Violence: Not At Risk (04/13/2022)   Humiliation, Afraid, Rape, and Kick questionnaire    Fear of Current or Ex-Partner: No    Emotionally Abused: No    Physically Abused: No    Sexually Abused: No    Outpatient Medications Prior to Visit  Medication Sig Dispense Refill   Cholecalciferol (VITAMIN D3 PO) Take 1 tablet by mouth daily.     ferrous sulfate 325 (65 FE) MG EC tablet Take 325 mg by mouth daily with breakfast.     lamoTRIgine (LAMICTAL) 100 MG tablet Take 100 mg by mouth daily.      Multiple Vitamin (MULTIVITAMIN) capsule Take 1 capsule by mouth daily.     sildenafil (REVATIO) 20 MG tablet Take 1 tablet (20 mg total) by mouth daily. Take 1-5 tablets as needed 90 tablet 3   telmisartan (MICARDIS) 20 MG tablet Take 1 tablet (20 mg total) by mouth daily. 90 tablet 1   Vilazodone HCl (VIIBRYD) 40 MG TABS Take 20 mg by mouth daily.      No  facility-administered medications prior to visit.    Allergies  Allergen Reactions   Penicillins Rash    Has patient had a PCN reaction causing immediate rash, facial/tongue/throat swelling, SOB or lightheadedness with hypotension: yes Has patient had a PCN reaction causing severe rash involving mucus membranes or skin necrosis: no Has patient had a PCN reaction that required hospitalization: no Has patient had a PCN reaction occurring within the last  10 years: no If all of the above answers are "NO", then may proceed with Cephalosporin use.     ROS Review of Systems  Constitutional:  Negative for chills and fever.  HENT:  Negative for congestion and sore throat.   Eyes:  Negative for pain and discharge.  Respiratory:  Negative for cough and shortness of breath.   Cardiovascular:  Positive for palpitations. Negative for chest pain.  Gastrointestinal:  Negative for diarrhea, nausea and vomiting.  Endocrine: Negative for polydipsia and polyuria.  Genitourinary:  Negative for dysuria and hematuria.  Musculoskeletal:  Positive for arthralgias (Left hand with swelling) and back pain. Negative for neck pain and neck stiffness.  Skin:  Negative for rash.  Neurological:  Negative for dizziness, weakness, numbness and headaches.  Psychiatric/Behavioral:  Positive for sleep disturbance. Negative for agitation, behavioral problems, hallucinations and suicidal ideas. The patient is nervous/anxious.       Objective:    Physical Exam Vitals reviewed.  Constitutional:      General: He is not in acute distress.    Appearance: He is not diaphoretic.  HENT:     Head: Normocephalic and atraumatic.     Nose: Nose normal.     Mouth/Throat:     Mouth: Mucous membranes are moist.  Eyes:     General: No scleral icterus.    Extraocular Movements: Extraocular movements intact.  Cardiovascular:     Rate and Rhythm: Normal rate and regular rhythm.     Pulses: Normal pulses.     Heart sounds:  Normal heart sounds. No murmur heard. Pulmonary:     Breath sounds: Normal breath sounds. No wheezing or rales.  Musculoskeletal:        General: Swelling (MCP, PIP and DIP joints of left hand - Mild) present.     Cervical back: Neck supple. No tenderness.     Right lower leg: No edema.     Left lower leg: No edema.  Skin:    General: Skin is warm.     Findings: No rash.     Comments: Erythematous papule over scalp  Neurological:     General: No focal deficit present.     Mental Status: He is alert and oriented to person, place, and time.     Sensory: No sensory deficit.     Motor: No weakness.  Psychiatric:        Mood and Affect: Mood normal.        Behavior: Behavior normal.     BP 138/89 (BP Location: Left Arm, Patient Position: Sitting, Cuff Size: Normal)   Pulse (!) 56   Ht 5\' 8"  (1.727 m)   Wt 137 lb 12.8 oz (62.5 kg)   SpO2 97%   BMI 20.95 kg/m  Wt Readings from Last 3 Encounters:  12/14/22 137 lb 12.8 oz (62.5 kg)  09/13/22 138 lb 6.4 oz (62.8 kg)  08/12/22 126 lb 12.8 oz (57.5 kg)    Lab Results  Component Value Date   TSH 2.270 09/14/2022   Lab Results  Component Value Date   WBC 6.7 09/14/2022   HGB 12.6 (L) 09/14/2022   HCT 35.7 (L) 09/14/2022   MCV 98 (H) 09/14/2022   PLT 211 09/14/2022   Lab Results  Component Value Date   NA 141 09/14/2022   K 4.4 09/14/2022   CO2 23 09/14/2022   GLUCOSE 97 09/14/2022   BUN 19 09/14/2022   CREATININE 0.99 09/14/2022   BILITOT 0.3 09/14/2022   ALKPHOS 77  09/14/2022   AST 20 09/14/2022   ALT 17 09/14/2022   PROT 6.8 09/14/2022   ALBUMIN 4.7 09/14/2022   CALCIUM 9.1 09/14/2022   EGFR 79 09/14/2022   Lab Results  Component Value Date   CHOL 187 09/14/2022   Lab Results  Component Value Date   HDL 53 09/14/2022   Lab Results  Component Value Date   LDLCALC 123 (H) 09/14/2022   Lab Results  Component Value Date   TRIG 57 09/14/2022   Lab Results  Component Value Date   CHOLHDL 3.5 09/14/2022    Lab Results  Component Value Date   HGBA1C 5.4 09/14/2022      Assessment & Plan:   Problem List Items Addressed This Visit   None  No orders of the defined types were placed in this encounter.   Follow-up: Return in about 6 months (around 06/15/2023) for HTN.    Lindell Spar, MD

## 2022-12-14 NOTE — Patient Instructions (Signed)
Please continue to take medications as prescribed.  Please continue to follow low salt diet and perform moderate exercise/walking at least 150 mins/week. 

## 2022-12-15 NOTE — Assessment & Plan Note (Signed)
Denies taking statin BP wnl now 

## 2022-12-15 NOTE — Assessment & Plan Note (Addendum)
Hb stable, ~12 On iron supplements Denies melena or hematochezia

## 2022-12-15 NOTE — Assessment & Plan Note (Signed)
BP Readings from Last 1 Encounters:  12/14/22 138/89   Well-controlled with telmisartan 20 mg QD Needs to quit smoking Counseled for compliance with the medications Advised DASH diet and moderate exercise/walking, at least 150 mins/week

## 2022-12-15 NOTE — Assessment & Plan Note (Signed)
Intermittent palpitations  EKG (12/23): Sinus bradycardia.  HR 49.  PAC noted. No signs of active ischemia.  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

## 2022-12-15 NOTE — Assessment & Plan Note (Signed)
Follows up with Psychiatry, also reports h/o OCD ?On Lamictal and Viibryd ?Needs to follow up with Psychiatry regularly ?

## 2022-12-27 DIAGNOSIS — X32XXXD Exposure to sunlight, subsequent encounter: Secondary | ICD-10-CM | POA: Diagnosis not present

## 2022-12-27 DIAGNOSIS — D225 Melanocytic nevi of trunk: Secondary | ICD-10-CM | POA: Diagnosis not present

## 2022-12-27 DIAGNOSIS — L570 Actinic keratosis: Secondary | ICD-10-CM | POA: Diagnosis not present

## 2022-12-27 DIAGNOSIS — Z1283 Encounter for screening for malignant neoplasm of skin: Secondary | ICD-10-CM | POA: Diagnosis not present

## 2022-12-27 DIAGNOSIS — D485 Neoplasm of uncertain behavior of skin: Secondary | ICD-10-CM | POA: Diagnosis not present

## 2023-02-02 ENCOUNTER — Encounter: Payer: Self-pay | Admitting: *Deleted

## 2023-02-13 ENCOUNTER — Encounter (INDEPENDENT_AMBULATORY_CARE_PROVIDER_SITE_OTHER): Payer: Self-pay | Admitting: *Deleted

## 2023-02-21 DIAGNOSIS — F429 Obsessive-compulsive disorder, unspecified: Secondary | ICD-10-CM | POA: Diagnosis not present

## 2023-02-21 DIAGNOSIS — N529 Male erectile dysfunction, unspecified: Secondary | ICD-10-CM | POA: Diagnosis not present

## 2023-02-21 DIAGNOSIS — F39 Unspecified mood [affective] disorder: Secondary | ICD-10-CM | POA: Diagnosis not present

## 2023-03-24 ENCOUNTER — Ambulatory Visit (INDEPENDENT_AMBULATORY_CARE_PROVIDER_SITE_OTHER): Payer: Medicare Other | Admitting: Internal Medicine

## 2023-03-24 ENCOUNTER — Encounter: Payer: Self-pay | Admitting: Internal Medicine

## 2023-03-24 VITALS — BP 126/76 | HR 95 | Ht 68.0 in | Wt 138.8 lb

## 2023-03-24 DIAGNOSIS — M5416 Radiculopathy, lumbar region: Secondary | ICD-10-CM | POA: Diagnosis not present

## 2023-03-24 DIAGNOSIS — I10 Essential (primary) hypertension: Secondary | ICD-10-CM

## 2023-03-24 NOTE — Patient Instructions (Addendum)
Please take Tylenol arthritis as needed for hip area pain.  Please perform simple back exercises as advised.

## 2023-03-24 NOTE — Assessment & Plan Note (Addendum)
His left buttock area pain likely radiating pain from lumbar spine Checked x-ray lumbar spine Could be lumbar spinal stenosis as he has relief of pain with bending Tylenol or ibuprofen as needed for pain Simple back exercises material provided If persistent hip area pain, may need x-ray of hip and/or orthopedic surgery evaluation

## 2023-03-24 NOTE — Assessment & Plan Note (Signed)
BP Readings from Last 1 Encounters:  03/24/23 126/76   Well-controlled with telmisartan 20 mg QD Needs to quit smoking Counseled for compliance with the medications Advised DASH diet and moderate exercise/walking, at least 150 mins/week

## 2023-03-24 NOTE — Progress Notes (Signed)
Acute Office Visit  Subjective:    Patient ID: Joshua Fuller, male    DOB: 08-23-1946, 77 y.o.   MRN: [id number]  Chief Complaint  Patient presents with   Hip Pain    Left hip pain becoming more noticeable over the last 6 weeks.    HPI Patient is in today for c/o left hip area and buttock area pain, which is intermittent, but has been worse for the last 6 weeks.  He denies any recent injury or fall.  He was having similar symptoms in 2023, and had x-ray of lumbar spine, which showed DDD of lumbar spine.  He prefers to avoid medicine for pain, but agrees to take Tylenol as needed for pain.  Denies any numbness or tingling of the LE.  Denies any urinary or stool incontinence.  Past Medical History:  Diagnosis Date   Allergy    penicillin   Anemia    [employer]rees[employer] 03/09/2020   Anxiety    Aortic atherosclerosis (HCC)    Arthritis    Some on right wrist   Blood transfusion without reported diagnosis    Dr. Juanetta Gosling ordered blood transfusions years ago   Cancer [facility])    Skin cancer   Chronic mental illness    OCD   COPD (chronic obstructive pulmonary disease) (HCC)    That's what I'm told   Depression    [employer]reesia 03/09/2020   Helicobacter pylori gastritis 08/29/2013   TOOK PYLERA    History of MRSA infection    of the skin   Hypertension    [employer]ia 03/09/2020   IDA (iron deficiency anemia)    Ileitis 11/06/2013   Neurofibroma of abdominal wall     Past Surgical History:  Procedure Laterality Date   ANKLE SURGERY     APPENDECTOMY N/A    [employer]ia 03/09/2020   Capsule endoscopy  02/19/2007   WUJ:WJXBJYN view of gastric mucosa/Normal small bowel mucosa   COLONOSCOPY  02/02/2007   SLF:Two 4-mm to 6-mm rectal polyps/sigmoid colon diverticulosis and internal hemorrhoids. hyperplastic polyps.    COLONOSCOPY N/A 03/22/2013   SLF: ANEMIA MOST LIKELY DUE TO ULCERS IN terminal ileum/Moderate diverticulosis  in the sigmoid colon/ONE RECTAL polyp removed(hyperplastic). ileum bx  with focal active ileitis. next TCS 03/2023 with overtube   COLONOSCOPY, ESOPHAGOGASTRODUODENOSCOPY (EGD) AND ESOPHAGEAL DILATION N/A 03/22/2013   WGN:FAOZHYQMV DUE Stricture at the gastroesophageal junction/Large hiatal hernia/MILD Non-erosive gastritis & DUODENITIS (+h.pylori)   DENTAL SURGERY  02/2020   ESOPHAGOGASTRODUODENOSCOPY  02/02/2007   HQI:ONGEX hiatal hernia closely examined and no evidence of Cameron ulcers.  Otherwise, normal esophagus. 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  Tobacco Use   Smoking status: Every Day    Current packs/day: 1.00    Average packs/day: 1 pack/day for 20.0 years (20.0 ttl pk-yrs)    Types: Cigarettes   Smokeless tobacco: Never  Vaping  Use   Vaping status: Never Used  Substance and Sexual Activity   Alcohol use: Yes    Alcohol/week: 1.0 standard drink of alcohol    Types: 1 Glasses of wine per week    Comment: Couple glasses of wine per month    Drug use: Yes    Types: Marijuana    Comment: Daily as needed   Sexual activity: Not Currently    Birth control/protection: None  Other Topics Concern   Not on file  Social History Narrative   Retired from [employer]    Social Determinants of Health   Financial Resource Strain: Low Risk  (04/13/2022)   Overall Financial Resource Strain (CARDIA)    Difficulty of Paying Living Expenses: Not hard at all  Food Insecurity: No Food Insecurity (04/13/2022)   Hunger Vital Sign    Worried About Running Out of Food in the Last Year: Never  true    Ran Out of Food in the Last Year: Never true  Transportation Needs: No Transportation Needs (04/13/2022)   PRAPARE - Administrator, Civil Service (Medical): No    Lack of Transportation (Non-Medical): No  Physical Activity: Inactive (04/13/2022)   Exercise Vital Sign    Days of Exercise per Week: 0 days    Minutes of Exercise per Session: 0 min  Stress: No Stress Concern Present (04/13/2022)   [employer] of Occupational Health - Occupational Stress Questionnaire    Feeling of Stress : Not at all  Social Connections: Socially Integrated (04/13/2022)   Social Connection and Isolation Panel [NHANES]    Frequency of Communication with Friends and Family: More than three times a week    Frequency of Social Gatherings with Friends and Family: Three times a week    Attends Religious Services: More than 4 times per year    Active Member of Clubs or Organizations: No    Attends Banker Meetings: More than 4 times per year    Marital Status: Married  Catering manager Violence: Not At Risk (04/13/2022)   Humiliation, Afraid, Rape, and Kick questionnaire    Fear of Current or Ex-Partner: No    Emotionally Abused: No    Physically Abused: No    Sexually Abused: No    Outpatient Medications Prior to Visit  Medication Sig Dispense Refill   Cholecalciferol (VITAMIN D3 PO) Take 1 tablet by mouth daily.     ferrous sulfate 325 (65 FE) MG EC tablet Take 325 mg by mouth daily with breakfast.     lamoTRIgine (LAMICTAL) 100 MG tablet Take 100 mg by mouth daily.      Multiple Vitamin (MULTIVITAMIN) capsule Take 1 capsule by mouth daily.     sildenafil (REVATIO) 20 MG tablet Take 1 tablet (20 mg total) by mouth daily. Take 1-5 tablets as needed 90 tablet 3   telmisartan (MICARDIS) 20 MG tablet Take 1 tablet (20 mg total) by mouth daily. 90 tablet 1   Vilazodone HCl (VIIBRYD) 40 MG TABS Take 20 mg by mouth daily.      No facility-administered medications prior to visit.     Allergies  Allergen Reactions   Penicillins Rash    Has patient had a PCN reaction causing immediate rash, facial/tongue/throat swelling, SOB or lightheadedness with hypotension: yes Has patient had a PCN reaction causing severe rash involving mucus membranes or skin necrosis: no Has patient had a PCN reaction that required hospitalization: no Has patient had a PCN reaction occurring within the last 10  years: no If all of the above answers are "NO", then may proceed with Cephalosporin use.     Review of Systems  Constitutional:  Negative for chills and fever.  Respiratory:  Negative for cough and shortness of breath.   Cardiovascular:  Negative for chest pain and palpitations.  Musculoskeletal:  Positive for back pain. Negative for neck pain and neck stiffness.  Skin:  Negative for rash.  Neurological:  Negative for dizziness and weakness.  Psychiatric/Behavioral:  Negative for agitation and behavioral problems.        Objective:    Physical Exam Vitals reviewed.  Constitutional:      General: He is not in acute distress.    Appearance: He is not diaphoretic.  HENT:     Head: Normocephalic and atraumatic.     Nose: Nose normal.     Mouth/Throat:     Mouth: Mucous membranes are moist.  Eyes:     General: No scleral icterus.    Extraocular Movements: Extraocular movements intact.  Cardiovascular:     Rate and Rhythm: Normal rate and regular rhythm.     Heart sounds: Normal heart sounds. No murmur heard. Pulmonary:     Breath sounds: Normal breath sounds. No wheezing or rales.  Musculoskeletal:        General: Swelling (MCP, PIP and DIP joints of left hand - Mild) present.     Cervical back: Neck supple. No tenderness.     Right lower leg: No edema.     Left lower leg: No edema.     Comments: No lumbar spinal tenderness ROM at hip normal  Skin:    General: Skin is warm.     Findings: No rash.     Comments: Erythematous papule over scalp  Neurological:      General: No focal deficit present.     Mental Status: He is alert and oriented to person, place, and time.     Sensory: No sensory deficit.     Motor: No weakness.  Psychiatric:        Mood and Affect: Mood normal.        Behavior: Behavior normal.     BP 126/76   Pulse 95   Ht 5\' 8"  (1.727 m)   Wt 138 lb 12.8 oz (63 kg)   SpO2 95%   BMI 21.10 kg/m  Wt Readings from Last 3 Encounters:  03/24/23 138 lb 12.8 oz (63 kg)  12/14/22 137 lb 12.8 oz (62.5 kg)  09/13/22 138 lb 6.4 oz (62.8 kg)        Assessment & Plan:   Problem List Items Addressed This Visit       Cardiovascular and Mediastinum   Essential hypertension    BP Readings from Last 1 Encounters:  03/24/23 126/76   Well-controlled with telmisartan 20 mg QD Needs to quit smoking Counseled for compliance with the medications Advised DASH diet and moderate exercise/walking, at least 150 mins/week        Nervous and Auditory   Lumbar radiculopathy - Primary    His left buttock area pain likely radiating pain from lumbar spine Checked x-ray lumbar spine Could be lumbar spinal stenosis as he has relief of pain with bending Tylenol or ibuprofen as needed for pain Simple back exercises material provided If persistent hip area pain, may need x-ray of hip and/or orthopedic surgery evaluation        No orders of the defined types were placed in this encounter.  Anabel Halon, MD

## 2023-04-07 DIAGNOSIS — F429 Obsessive-compulsive disorder, unspecified: Secondary | ICD-10-CM | POA: Diagnosis not present

## 2023-04-07 DIAGNOSIS — F39 Unspecified mood [affective] disorder: Secondary | ICD-10-CM | POA: Diagnosis not present

## 2023-04-07 DIAGNOSIS — N529 Male erectile dysfunction, unspecified: Secondary | ICD-10-CM | POA: Diagnosis not present

## 2023-04-20 ENCOUNTER — Other Ambulatory Visit: Payer: Self-pay | Admitting: Internal Medicine

## 2023-04-20 ENCOUNTER — Ambulatory Visit (INDEPENDENT_AMBULATORY_CARE_PROVIDER_SITE_OTHER): Payer: Medicare Other

## 2023-04-20 VITALS — BP 126/76 | Ht 68.0 in | Wt 138.0 lb

## 2023-04-20 DIAGNOSIS — Z0100 Encounter for examination of eyes and vision without abnormal findings: Secondary | ICD-10-CM

## 2023-04-20 DIAGNOSIS — I10 Essential (primary) hypertension: Secondary | ICD-10-CM

## 2023-04-20 DIAGNOSIS — Z0000 Encounter for general adult medical examination without abnormal findings: Secondary | ICD-10-CM | POA: Diagnosis not present

## 2023-04-20 NOTE — Patient Instructions (Addendum)
[name] , Thank you for taking time to come for your Medicare Wellness Visit. I appreciate your ongoing commitment to your health goals. Please review the following plan we discussed and let me know if I can assist you in the future.   These are the goals we discussed:  Goals      Patient Stated     Spiritual goals     Patient Stated     "Quit smoking"        This is a list of the screening recommended for you and due dates:  Health Maintenance  Topic Date Due   DTaP/Tdap/Td vaccine (1 - Tdap) Never done   Zoster (Shingles) Vaccine (1 of 2) Never done   COVID-19 Vaccine (4 - 2023-24 season) 05/13/2022   Flu Shot  04/13/2023   Screening for Lung Cancer  09/21/2023   Medicare Annual Wellness Visit  04/19/2024   Pneumonia Vaccine  Completed   Hepatitis C Screening  Completed   HPV Vaccine  Aged Out   Colon Cancer Screening  Discontinued    Advanced directives: Advance directive discussed with you today. Even though you declined this today, please call our office should you change your mind, and we can give you the proper paperwork for you to fill out. Advance care planning is a way to make decisions about medical care that fits your values in case you are ever unable to make these decisions for yourself.  Information on Advanced Care Planning can be found at [facility] Advance Health Care Directives Advance Health Care Directives ([email or web address]    Conditions/risks identified: Steps to Quit Smoking Smoking tobacco is the leading cause of preventable death. It can affect almost every organ in the body. Smoking puts you and people around you at risk for many serious, long-lasting (chronic) diseases. Quitting smoking can be hard, but it is one of the best things that you can do for your health. It is never too late to quit. Do not give up if you cannot quit the first time. Some people need to try many times to quit. Do your best to stick to your quit plan, and  talk with your doctor if you have any questions or concerns. How do I get ready to quit? Pick a date to quit. Set a date within the next 2 weeks to give you time to prepare. Write down the reasons why you are quitting. Keep this list in places where you will see it often. Tell your family, friends, and co-workers that you are quitting. Their support is important. Talk with your doctor about the choices that may help you quit. Find out if your health insurance will pay for these treatments. Know the people, places, things, and activities that make you want to smoke (triggers). Avoid them. What first steps can I take to quit smoking? Throw away all cigarettes at home, at work, and in your car. Throw away the things that you use when you smoke, such as ashtrays and lighters. Clean your car. Empty the ashtray. Clean your home, including curtains and carpets. What can I do to help me quit smoking? Talk with your doctor about taking medicines and seeing a counselor. You are more likely to succeed when you do both. If you are pregnant or breastfeeding: Talk with your doctor about counseling or other ways to quit smoking. Do not take medicine to help you quit smoking unless your doctor tells you to. Quit right away Quit smoking completely,  instead of slowly cutting back on how much you smoke over a period of time. Stopping smoking right away may be more successful than slowly quitting. Go to counseling. In-person is best if this is an option. You are more likely to quit if you go to counseling sessions regularly. Take medicine You may take medicines to help you quit. Some medicines need a prescription, and some you can buy over-the-counter. Some medicines may contain a drug called nicotine to replace the nicotine in cigarettes. Medicines may: Help you stop having the desire to smoke (cravings). Help to stop the problems that come when you stop smoking (withdrawal symptoms). Your doctor may ask you  to use: Nicotine patches, gum, or lozenges. Nicotine inhalers or sprays. Non-nicotine medicine that you take by mouth. Find resources Find resources and other ways to help you quit smoking and remain smoke-free after you quit. They include: Online chats with a Veterinary surgeon. Phone quitlines. Printed Materials engineer. Support groups or group counseling. Text messaging programs. Mobile phone apps. Use apps on your mobile phone or tablet that can help you stick to your quit plan. Examples of free services include Quit Guide from the CDC and smokefree.gov  What can I do to make it easier to quit?  Talk to your family and friends. Ask them to support and encourage you. Call a phone quitline, such as 1-800-QUIT-NOW, reach out to support groups, or work with a Veterinary surgeon. Ask people who smoke to not smoke around you. Avoid places that make you want to smoke, such as: Bars. Parties. Smoke-break areas at work. Spend time with people who do not smoke. Lower the stress in your life. Stress can make you want to smoke. Try these things to lower stress: Getting regular exercise. Doing deep-breathing exercises. Doing yoga. Meditating. What benefits will I see if I quit smoking? Over time, you may have: A better sense of smell and taste. Less coughing and sore throat. A slower heart rate. Lower blood pressure. Clearer skin. Better breathing. Fewer sick days. Summary Quitting smoking can be hard, but it is one of the best things that you can do for your health. Do not give up if you cannot quit the first time. Some people need to try many times to quit. When you decide to quit smoking, make a plan to help you succeed. Quit smoking right away, not slowly over a period of time. When you start quitting, get help and support to keep you smoke-free. This information is not intended to replace advice given to you by your health care provider. Make sure you discuss any questions you have with your  health care provider. Document Revised: 08/20/2021 Document Reviewed: 08/20/2021 Elsevier Patient Education  2024 Elsevier Inc.  You are due for the vaccines checked below. You may have these done at your preferred pharmacy. Please have them fax the office proof of the vaccines so that we can update your chart.   [x]  Flu (due annually) [x]  Shingrix (Shingles vaccine) Can be done at your preferred pharmacy []  Pneumonia Vaccines [x]  TDAP (Tetanus) Vaccine every 10 years Can be done at your preferred pharmacy []  Covid-19  A referral has been placed for you to establish with an eye doctor. If you have not heard from someone in 7-10 business days please call our office so we can help you get an appointment.   Next appointment: VIRTUAL/ TELEPHONE VISIT Follow up in one year for your annual wellness visit  June 12, 2024 at [facility] telephone visit  Preventive Care 34 Years and Older, Male  Preventive care refers to lifestyle choices and visits with your health care provider that can promote health and wellness. What does preventive care include? A yearly physical exam. This is also called an annual well check. Dental exams once or twice a year. Routine eye exams. Ask your health care provider how often you should have your eyes checked. Personal lifestyle choices, including: Daily care of your teeth and gums. Regular physical activity. Eating a healthy diet. Avoiding tobacco and drug use. Limiting alcohol use. Practicing safe sex. Taking low doses of aspirin every day. Taking vitamin and mineral supplements as recommended by your health care provider. What happens during an annual well check? The services and screenings done by your health care provider during your annual well check will depend on your age, overall health, lifestyle risk factors, and family history of disease. Counseling  Your health care provider may ask you questions about your: Alcohol use. Tobacco use. Drug  use. Emotional well-being. Home and relationship well-being. Sexual activity. Eating habits. History of falls. Memory and ability to understand (cognition). Work and work Astronomer. Screening  You may have the following tests or measurements: Height, weight, and BMI. Blood pressure. Lipid and cholesterol levels. These may be checked every 5 years, or more frequently if you are over 27 years old. Skin check. Lung cancer screening. You may have this screening every year starting at age 30 if you have a 30-pack-year history of smoking and currently smoke or have quit within the past 15 years. Fecal occult blood test (FOBT) of the stool. You may have this test every year starting at age 65. Flexible sigmoidoscopy or colonoscopy. You may have a sigmoidoscopy every 5 years or a colonoscopy every 10 years starting at age 51. Prostate cancer screening. Recommendations will vary depending on your family history and other risks. Hepatitis C blood test. Hepatitis B blood test. Sexually transmitted disease (STD) testing. Diabetes screening. This is done by checking your blood sugar (glucose) after you have not eaten for a while (fasting). You may have this done every 1-3 years. Abdominal aortic aneurysm (AAA) screening. You may need this if you are a current or former smoker. Osteoporosis. You may be screened starting at age 24 if you are at high risk. Talk with your health care provider about your test results, treatment options, and if necessary, the need for more tests. Vaccines  Your health care provider may recommend certain vaccines, such as: Influenza vaccine. This is recommended every year. Tetanus, diphtheria, and acellular pertussis (Tdap, Td) vaccine. You may need a Td booster every 10 years. Zoster vaccine. You may need this after age 66. Pneumococcal 13-valent conjugate (PCV13) vaccine. One dose is recommended after age 30. Pneumococcal polysaccharide (PPSV23) vaccine. One dose is  recommended after age 67. Talk to your health care provider about which screenings and vaccines you need and how often you need them. This information is not intended to replace advice given to you by your health care provider. Make sure you discuss any questions you have with your health care provider. Document Released: 09/25/2015 Document Revised: 05/18/2016 Document Reviewed: 06/30/2015 Elsevier Interactive Patient Education  2017 [employer].  Fall Prevention in the Home Falls can cause injuries. They can happen to people of all ages. There are many things you can do to make your home safe and to help prevent falls. What can I do on the outside of my home? Regularly fix the edges of walkways and  driveways and fix any cracks. Remove anything that might make you trip as you walk through a door, such as a raised step or threshold. Trim any bushes or trees on the path to your home. Use bright outdoor lighting. Clear any walking paths of anything that might make someone trip, such as rocks or tools. Regularly check to see if handrails are loose or broken. Make sure that both sides of any steps have handrails. Any raised decks and porches should have guardrails on the edges. Have any leaves, snow, or ice cleared regularly. Use sand or salt on walking paths during winter. Clean up any spills in your garage right away. This includes oil or grease spills. What can I do in the bathroom? Use night lights. Install grab bars by the toilet and in the tub and shower. Do not use towel bars as grab bars. Use non-skid mats or decals in the tub or shower. If you need to sit down in the shower, use a plastic, non-slip stool. Keep the floor dry. Clean up any water that spills on the floor as soon as it happens. Remove soap buildup in the tub or shower regularly. Attach bath mats securely with double-sided non-slip rug tape. Do not have throw rugs and other things on the floor that can make you  trip. What can I do in the bedroom? Use night lights. Make sure that you have a light by your bed that is easy to reach. Do not use any sheets or blankets that are too big for your bed. They should not hang down onto the floor. Have a firm chair that has side arms. You can use this for support while you get dressed. Do not have throw rugs and other things on the floor that can make you trip. What can I do in the kitchen? Clean up any spills right away. Avoid walking on wet floors. Keep items that you use a lot in easy-to-reach places. If you need to reach something above you, use a strong step stool that has a grab bar. Keep electrical cords out of the way. Do not use floor polish or wax that makes floors slippery. If you must use wax, use non-skid floor wax. Do not have throw rugs and other things on the floor that can make you trip. What can I do with my stairs? Do not leave any items on the stairs. Make sure that there are handrails on both sides of the stairs and use them. Fix handrails that are broken or loose. Make sure that handrails are as long as the stairways. Check any carpeting to make sure that it is firmly attached to the stairs. Fix any carpet that is loose or worn. Avoid having throw rugs at the top or bottom of the stairs. If you do have throw rugs, attach them to the floor with carpet tape. Make sure that you have a light switch at the top of the stairs and the bottom of the stairs. If you do not have them, ask someone to add them for you. What else can I do to help prevent falls? Wear shoes that: Do not have high heels. Have rubber bottoms. Are comfortable and fit you well. Are closed at the toe. Do not wear sandals. If you use a stepladder: Make sure that it is fully opened. Do not climb a closed stepladder. Make sure that both sides of the stepladder are locked into place. Ask someone to hold it for you, if possible. Clearly mark  and make sure that you can  see: Any grab bars or handrails. First and last steps. Where the edge of each step is. Use tools that help you move around (mobility aids) if they are needed. These include: Canes. Walkers. Scooters. Crutches. Turn on the lights when you go into a dark area. Replace any light bulbs as soon as they burn out. Set up your furniture so you have a clear path. Avoid moving your furniture around. If any of your floors are uneven, fix them. If there are any pets around you, be aware of where they are. Review your medicines with your doctor. Some medicines can make you feel dizzy. This can increase your chance of falling. Ask your doctor what other things that you can do to help prevent falls. This information is not intended to replace advice given to you by your health care provider. Make sure you discuss any questions you have with your health care provider. Document Released: 06/25/2009 Document Revised: 02/04/2016 Document Reviewed: 10/03/2014 Elsevier Interactive Patient Education  2017 [employer].

## 2023-04-20 NOTE — Progress Notes (Signed)
Because this visit was a virtual/telehealth visit,  certain criteria was not obtained, such a blood pressure, CBG if patient is a diabetic, and timed up and go. Any medications not marked as "taking" was not mentioned during the medication reconciliation part of the visit. Any vitals not documented were not able to be obtained due to this being a telehealth visit. Vitals documented are verbally provided by the patient. Patient states there have been no changes in vitals since last visit  Subjective:   Joshua Fuller is a 77 y.o. male who presents for Medicare Annual/Subsequent preventive examination.  Visit Complete: Virtual  I connected with  York Pellant on 04/20/23 by a audio enabled telemedicine application and verified that I am speaking with the correct person using two identifiers.  Patient Location: Home  Provider Location: Home Office  I discussed the limitations of evaluation and management by telemedicine. The patient expressed understanding and agreed to proceed.  Patient Medicare AWV questionnaire was completed by the patient on n/a; I have confirmed that all information answered by patient is correct and no changes since this date.  Review of Systems     Cardiac Risk Factors include: advanced age (>25men, >35 women);dyslipidemia;hypertension;male gender;sedentary lifestyle;smoking/ tobacco exposure     Objective:    Today's Vitals   04/20/23 1123  BP: 126/76  Weight: 138 lb (62.6 kg)  Height: 5\' 8"  (1.727 m)   Body mass index is 20.98 kg/m.     04/20/2023   11:26 AM 04/13/2022    2:02 PM 04/12/2021   10:39 AM 02/09/2018   11:49 AM 03/22/2013   10:01 AM  Advanced Directives  Does Patient Have a Medical Advance Directive? No No Yes No Patient does not have advance directive;Patient would not like information  Type of Advance Directive   Living will    Does patient want to make changes to medical advance directive?   No - Patient declined    Would patient like  information on creating a medical advance directive? No - Patient declined No - Patient declined No - Patient declined No - Patient declined   Pre-existing out of facility DNR order (yellow form or pink MOST form)     No    Current Medications (verified) Outpatient Encounter Medications as of 04/20/2023  Medication Sig   Cholecalciferol (VITAMIN D3 PO) Take 1 tablet by mouth daily.   ferrous sulfate 325 (65 FE) MG EC tablet Take 325 mg by mouth daily with breakfast.   lamoTRIgine (LAMICTAL) 100 MG tablet Take 100 mg by mouth daily.    Multiple Vitamin (MULTIVITAMIN) capsule Take 1 capsule by mouth daily.   sildenafil (REVATIO) 20 MG tablet Take 1 tablet (20 mg total) by mouth daily. Take 1-5 tablets as needed   telmisartan (MICARDIS) 20 MG tablet Take 1 tablet (20 mg total) by mouth daily.   Vilazodone HCl (VIIBRYD) 40 MG TABS Take 20 mg by mouth daily.    No facility-administered encounter medications on file as of 04/20/2023.    Allergies (verified) Penicillins   History: Past Medical History:  Diagnosis Date   Allergy    penicillin   Anemia    Phreesia 03/09/2020   Anxiety    Aortic atherosclerosis (HCC)    Arthritis    Some on right wrist   Blood transfusion without reported diagnosis    Dr. Juanetta Gosling ordered blood transfusions years ago   Cancer [facility])    Skin cancer   Chronic mental illness    OCD  COPD (chronic obstructive pulmonary disease) (HCC)    That's what I'm told   Depression    Phreesia 03/09/2020   Helicobacter pylori gastritis 08/29/2013   TOOK PYLERA    History of MRSA infection    of the skin   Hypertension    Phreesia 03/09/2020   IDA (iron deficiency anemia)    Ileitis 11/06/2013   Neurofibroma of abdominal wall    Past Surgical History:  Procedure Laterality Date   ANKLE SURGERY     APPENDECTOMY N/A    Phreesia 03/09/2020   Capsule endoscopy  02/19/2007   ZOX:[id number] view of gastric mucosa/Normal small bowel mucosa   COLONOSCOPY  02/02/2007    SLF:Two 4-mm to 6-mm rectal polyps/sigmoid colon diverticulosis and internal hemorrhoids. hyperplastic polyps.    COLONOSCOPY N/A 03/22/2013   SLF: ANEMIA MOST LIKELY DUE TO ULCERS IN terminal ileum/Moderate diverticulosis  in the sigmoid colon/ONE RECTAL polyp removed(hyperplastic). ileum bx with focal active ileitis. next TCS 03/2023 with overtube   COLONOSCOPY, ESOPHAGOGASTRODUODENOSCOPY (EGD) AND ESOPHAGEAL DILATION N/A 03/22/2013   UJW:[id number] DUE Stricture at the gastroesophageal junction/Large hiatal hernia/MILD Non-erosive gastritis & DUODENITIS (+h.pylori)   DENTAL SURGERY  02/2020   ESOPHAGOGASTRODUODENOSCOPY  02/02/2007   FAO:ZHYQM hiatal hernia closely examined and no evidence of Cameron ulcers.  Otherwise, normal esophagus. 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  Tobacco Use   Smoking status: Every Day    Current packs/day: 1.00    Average packs/day: 1 pack/day for 20.0 years (20.0 ttl pk-yrs)    Types: Cigarettes   Smokeless tobacco: Never  Vaping Use   Vaping status: Never Used  Substance and Sexual Activity   Alcohol use: Yes    Alcohol/week: 1.0 standard drink of alcohol    Types: 1 Glasses of wine per week    Comment: Couple glasses of wine per month    Drug use: Yes    Types: Marijuana    Comment: Daily as needed   Sexual activity: Not Currently    Birth control/protection: None  Other Topics Concern   Not on file  Social History Narrative   Retired from [employer]    Social Determinants of  Health   Financial Resource Strain: Low Risk  (04/13/2022)   Overall Financial Resource Strain (CARDIA)    Difficulty of Paying Living Expenses: Not hard at all  Food Insecurity: No Food Insecurity (04/20/2023)   Hunger Vital Sign    Worried About Running Out of Food in the Last Year: Never true    Ran Out of Food in the Last Year: Never true  Transportation Needs: No Transportation Needs (04/20/2023)   PRAPARE - Administrator, Civil Service (Medical): No    Lack of Transportation (Non-Medical): No  Physical Activity: Inactive (04/20/2023)   Exercise Vital Sign    Days of Exercise per Week: 0 days    Minutes of  Exercise per Session: 0 min  Stress: No Stress Concern Present (04/20/2023)   [employer] of Occupational Health - Occupational Stress Questionnaire    Feeling of Stress : Not at all  Social Connections: Moderately Integrated (04/20/2023)   Social Connection and Isolation Panel [NHANES]    Frequency of Communication with Friends and Family: More than three times a week    Frequency of Social Gatherings with Friends and Family: More than three times a week    Attends Religious Services: More than 4 times per year    Active Member of [employer] or Organizations: No    Attends Engineer, structural: Never    Marital Status: Married    Tobacco Counseling Ready to quit: Yes Counseling given: Yes   Clinical Intake:  Pre-visit preparation completed: Yes  Pain : No/denies pain     BMI - recorded: 20.98 Nutritional Status: BMI of 19-24  Normal Nutritional Risks: None Diabetes: No  How often do you need to have someone help you when you read instructions, pamphlets, or other written materials from your doctor or pharmacy?: 1 - Never  Interpreter Needed?: No  Information entered by :: Abby Arieh Bogue, CMA   Activities of Daily Living    04/20/2023   11:25 AM  In your present state of health, do you have any difficulty performing the following activities:   Hearing? 0  Vision? 1  Comment patient requests a referral for an eye doctor  Difficulty concentrating or making decisions? 0  Walking or climbing stairs? 0  Dressing or bathing? 0  Doing errands, shopping? 0  Preparing Food and eating ? N  Using the Toilet? N  In the past six months, have you accidently leaked urine? N  Do you have problems with loss of bowel control? N  Managing your Medications? N  Managing your Finances? N  Housekeeping or managing your Housekeeping? N    Patient Care Team: Anabel Halon, MD as PCP - General (Internal Medicine) West Bali, MD (Inactive) as Consulting Physician (Gastroenterology)  Indicate any recent Medical Services you may have received from other than [facility] providers in the past year (date may be approximate).     Assessment:   This is a routine wellness examination for Tade.  Hearing/Vision screen Hearing Screening - Comments:: Patient denies any hearing difficulties.    Dietary issues and exercise activities discussed:     Goals Addressed             This Visit's Progress    Patient Stated       "Quit smoking"       Depression Screen    04/20/2023   11:27 AM 03/24/2023   10:20 AM 12/14/2022    9:58 AM 09/13/2022   11:31 AM 08/12/2022   10:00 AM 06/14/2022    9:38 AM 04/13/2022    2:03 PM  PHQ 2/9 Scores  PHQ - 2 Score 0 2 2 0 1 0 0  PHQ- 9 Score 1 6 5  4  0     Fall Risk    04/20/2023   11:26 AM 03/24/2023   10:20 AM 12/14/2022    9:58 AM 09/13/2022   11:31 AM 08/12/2022   10:00 AM  Fall Risk   Falls in the past year? 0 0 0 1 0  Number falls in past yr: 0 0 0 0 0  Injury with Fall? 0 0 0 0 0  Risk for fall due to : No Fall Risks No Fall  Risks     Follow up Falls prevention discussed Falls evaluation completed       MEDICARE RISK AT HOME:  Medicare Risk at Home - 04/20/23 1127     Any stairs in or around the home? No    If so, are there any without handrails? No    Home free of loose throw rugs in walkways,  pet beds, electrical cords, etc? Yes    Adequate lighting in your home to reduce risk of falls? Yes    Life alert? No    Use of a cane, walker or w/c? No    Grab bars in the bathroom? No    Shower chair or bench in shower? No    Elevated toilet seat or a handicapped toilet? No             TIMED UP AND GO:  Was the test performed?  No    Cognitive Function:        04/20/2023   11:27 AM 04/13/2022    2:04 PM 04/12/2021   10:48 AM  6CIT Screen  What Year? 0 points 0 points 0 points  What month? 0 points 0 points 0 points  What time? 0 points 0 points 0 points  Count back from 20 0 points 0 points 0 points  Months in reverse 0 points 0 points 0 points  Repeat phrase 0 points 0 points 0 points  Total Score 0 points 0 points 0 points    Immunizations Immunization History  Administered Date(s) Administered   Fluad Quad(high Dose 65+) 07/10/2020, 06/10/2021, 08/12/2022   Moderna SARS-COV2 Booster Vaccination 10/02/2020   Moderna Sars-Covid-2 Vaccination 10/27/2019, 11/25/2019   PNEUMOCOCCAL CONJUGATE-20 12/08/2021   Pneumococcal Conjugate-13 12/19/2019    TDAP status: Due, Education has been provided regarding the importance of this vaccine. Advised may receive this vaccine at local pharmacy or Health Dept. Aware to provide a copy of the vaccination record if obtained from local pharmacy or Health Dept. Verbalized acceptance and understanding.  Flu Vaccine status: Due, Education has been provided regarding the importance of this vaccine. Advised may receive this vaccine at local pharmacy or Health Dept. Aware to provide a copy of the vaccination record if obtained from local pharmacy or Health Dept. Verbalized acceptance and understanding.  Pneumococcal vaccine status: Up to date  Covid-19 vaccine status: Information provided on how to obtain vaccines.   Qualifies for Shingles Vaccine? Yes   Zostavax completed No   Shingrix Completed?: No.    Education has been provided  regarding the importance of this vaccine. Patient has been advised to call insurance company to determine out of pocket expense if they have not yet received this vaccine. Advised may also receive vaccine at local pharmacy or Health Dept. Verbalized acceptance and understanding.  Screening Tests Health Maintenance  Topic Date Due   DTaP/Tdap/Td (1 - Tdap) Never done   Zoster Vaccines- Shingrix (1 of 2) Never done   COVID-19 Vaccine (4 - 2023-24 season) 05/13/2022   Medicare Annual Wellness (AWV)  04/14/2023   INFLUENZA VACCINE  04/13/2023   Lung Cancer Screening  09/21/2023   Pneumonia Vaccine 63+ Years old  Completed   Hepatitis C Screening  Completed   HPV VACCINES  Aged Out   Colonoscopy  Discontinued    Health Maintenance  Health Maintenance Due  Topic Date Due   DTaP/Tdap/Td (1 - Tdap) Never done   Zoster Vaccines- Shingrix (1 of 2) Never done   COVID-19 Vaccine (4 - 2023-24  season) 05/13/2022   Medicare Annual Wellness (AWV)  04/14/2023   INFLUENZA VACCINE  04/13/2023    Colorectal cancer screening: Type of screening: Colonoscopy. Completed 03/22/2013. Repeat every discontinued years  Lung Cancer Screening: (Low Dose CT Chest recommended if Age 48-80 years, 20 pack-year currently smoking OR have quit w/in 15years.) does qualify.   Lung Cancer Screening Referral: 09/20/2022   Additional Screening:  Hepatitis C Screening: does not qualify; Completed 12/19/2019  Vision Screening: Recommended annual ophthalmology exams for early detection of glaucoma and other disorders of the eye. Is the patient up to date with their annual eye exam?  No  Who is the provider or what is the name of the office in which the patient attends annual eye exams? none If pt is not established with a provider, would they like to be referred to a provider to establish care? Yes .   Dental Screening: Recommended annual dental exams for proper oral hygiene  Diabetic Foot Exam: n/a  Community Resource  Referral / Chronic Care Management: CRR required this visit?  No   CCM required this visit?  No     Plan:     I have personally reviewed and noted the following in the patient's chart:   Medical and social history Use of alcohol, tobacco or illicit drugs  Current medications and supplements including opioid prescriptions. Patient is not currently taking opioid prescriptions. Functional ability and status Nutritional status Physical activity Advanced directives List of other physicians Hospitalizations, surgeries, and ER visits in previous 12 months Vitals Screenings to include cognitive, depression, and falls Referrals and appointments  In addition, I have reviewed and discussed with patient certain preventive protocols, quality metrics, and best practice recommendations. A written personalized care plan for preventive services as well as general preventive health recommendations were provided to patient.     Jordan Hawks Arrow Tomko, CMA   04/20/2023   After Visit Summary: (MyChart) Due to this being a telephonic visit, the after visit summary with patients personalized plan was offered to patient via MyChart   Nurse Notes:

## 2023-04-21 ENCOUNTER — Ambulatory Visit (INDEPENDENT_AMBULATORY_CARE_PROVIDER_SITE_OTHER): Payer: Medicare Other | Admitting: Internal Medicine

## 2023-04-21 ENCOUNTER — Encounter: Payer: Self-pay | Admitting: Internal Medicine

## 2023-04-21 VITALS — BP 134/80 | HR 59 | Ht 68.0 in | Wt 143.4 lb

## 2023-04-21 DIAGNOSIS — D500 Iron deficiency anemia secondary to blood loss (chronic): Secondary | ICD-10-CM

## 2023-04-21 DIAGNOSIS — M5416 Radiculopathy, lumbar region: Secondary | ICD-10-CM | POA: Diagnosis not present

## 2023-04-21 MED ORDER — NAPROXEN 500 MG PO TABS
500.0000 mg | ORAL_TABLET | Freq: Every day | ORAL | 2 refills | Status: DC
Start: 2023-04-21 — End: 2023-06-22

## 2023-04-21 NOTE — Progress Notes (Signed)
Acute Office Visit  Subjective:    Patient ID: Joshua Fuller, male    DOB: 1946-08-07, 77 y.o.   MRN: [id number]  Chief Complaint  Patient presents with   Back Pain    Patient is having back pain , wants to discuss options    HPI Patient is in today for c/o left hip area and buttock area pain, which is intermittent, but had been worse for the last 8 weeks.  He denies any recent injury or fall.  He was having similar symptoms in 2023, and had x-ray of lumbar spine, which showed DDD of lumbar spine.  He recently tried naproxen and felt improvement in pain.  He was advised to take Tylenol arthritis in the last visit.  Denies any numbness or tingling of the LE.  Denies any urinary or stool incontinence.   Past Medical History:  Diagnosis Date   Allergy    penicillin   Anemia    Phreesia 03/09/2020   Anxiety    Aortic atherosclerosis (HCC)    Arthritis    Some on right wrist   Blood transfusion without reported diagnosis    Dr. Juanetta Gosling ordered blood transfusions years ago   Cancer [facility])    Skin cancer   Chronic mental illness    OCD   COPD (chronic obstructive pulmonary disease) (HCC)    That's what I'm told   Depression    Phreesia 03/09/2020   Helicobacter pylori gastritis 08/29/2013   TOOK PYLERA    History of MRSA infection    of the skin   Hypertension    Phreesia 03/09/2020   IDA (iron deficiency anemia)    Ileitis 11/06/2013   Neurofibroma of abdominal wall     Past Surgical History:  Procedure Laterality Date   ANKLE SURGERY     APPENDECTOMY N/A    Phreesia 03/09/2020   Capsule endoscopy  02/19/2007   JXB:JYNWGNF view of gastric mucosa/Normal small bowel mucosa   COLONOSCOPY  02/02/2007   SLF:Two 4-mm to 6-mm rectal polyps/sigmoid colon diverticulosis and internal hemorrhoids. hyperplastic polyps.    COLONOSCOPY N/A 03/22/2013   SLF: ANEMIA MOST LIKELY DUE TO ULCERS IN terminal ileum/Moderate diverticulosis  in the sigmoid colon/ONE RECTAL polyp  removed(hyperplastic). ileum bx with focal active ileitis. next TCS 03/2023 with overtube   COLONOSCOPY, ESOPHAGOGASTRODUODENOSCOPY (EGD) AND ESOPHAGEAL DILATION N/A 03/22/2013   AOZ:[id number] DUE Stricture at the gastroesophageal junction/Large hiatal hernia/MILD Non-erosive gastritis & DUODENITIS (+h.pylori)   DENTAL SURGERY  02/2020   ESOPHAGOGASTRODUODENOSCOPY  02/02/2007   NGE:XBMWU hiatal hernia closely examined and no evidence of Cameron ulcers.  Otherwise, normal esophagus. 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  Tobacco Use   Smoking status: Every Day    Current packs/day: 1.00    Average packs/day: 1 pack/day for 20.0 years (20.0 ttl pk-yrs)    Types: Cigarettes  Smokeless tobacco: Never  Vaping Use   Vaping status: Never Used  Substance and Sexual Activity   Alcohol use: Yes    Alcohol/week: 1.0 standard drink of alcohol    Types: 1 Glasses of wine per week    Comment: Couple glasses of wine per month    Drug use: Yes    Types: Marijuana    Comment: Daily as needed   Sexual activity: Not Currently    Birth control/protection: None  Other Topics Concern   Not on file  Social History Narrative   Retired from [employer]    Social Determinants of Health   Financial Resource Strain: Low Risk  (04/13/2022)   Overall Financial Resource Strain (CARDIA)    Difficulty of Paying Living Expenses: Not hard at all  Food Insecurity: No Food Insecurity (04/20/2023)   Hunger Vital Sign    Worried About Running Out  of Food in the Last Year: Never true    Ran Out of Food in the Last Year: Never true  Transportation Needs: No Transportation Needs (04/20/2023)   PRAPARE - Administrator, Civil Service (Medical): No    Lack of Transportation (Non-Medical): No  Physical Activity: Inactive (04/20/2023)   Exercise Vital Sign    Days of Exercise per Week: 0 days    Minutes of Exercise per Session: 0 min  Stress: No Stress Concern Present (04/20/2023)   [employer] of Occupational Health - Occupational Stress Questionnaire    Feeling of Stress : Not at all  Social Connections: Moderately Integrated (04/20/2023)   Social Connection and Isolation Panel [NHANES]    Frequency of Communication with Friends and Family: More than three times a week    Frequency of Social Gatherings with Friends and Family: More than three times a week    Attends Religious Services: More than 4 times per year    Active Member of [employer] or Organizations: No    Attends Banker Meetings: Never    Marital Status: Married  Catering manager Violence: Not At Risk (04/20/2023)   Humiliation, Afraid, Rape, and Kick questionnaire    Fear of Current or Ex-Partner: No    Emotionally Abused: No    Physically Abused: No    Sexually Abused: No    Outpatient Medications Prior to Visit  Medication Sig Dispense Refill   Cholecalciferol (VITAMIN D3 PO) Take 1 tablet by mouth daily.     ferrous sulfate 325 (65 FE) MG EC tablet Take 325 mg by mouth daily with breakfast.     lamoTRIgine (LAMICTAL) 100 MG tablet Take 100 mg by mouth daily.      Multiple Vitamin (MULTIVITAMIN) capsule Take 1 capsule by mouth daily.     sildenafil (REVATIO) 20 MG tablet Take 1 tablet (20 mg total) by mouth daily. Take 1-5 tablets as needed 90 tablet 3   telmisartan (MICARDIS) 20 MG tablet TAKE 1 TABLET DAILY 90 tablet 3   Vilazodone HCl (VIIBRYD) 40 MG TABS Take 20 mg by mouth daily.      No facility-administered medications prior to visit.     Allergies  Allergen Reactions   Penicillins Rash    Has patient had a PCN reaction causing immediate rash, facial/tongue/throat swelling, SOB or lightheadedness with hypotension: yes Has patient had a PCN reaction causing severe rash involving mucus membranes or skin necrosis: no Has patient had a PCN reaction that required hospitalization: no Has patient had a PCN reaction occurring within the last 10 years: no If  all of the above answers are "NO", then may proceed with Cephalosporin use.     Review of Systems  Constitutional:  Negative for chills and fever.  Respiratory:  Negative for cough and shortness of breath.   Cardiovascular:  Negative for chest pain and palpitations.  Musculoskeletal:  Positive for back pain. Negative for neck pain and neck stiffness.  Skin:  Negative for rash.  Neurological:  Negative for dizziness and weakness.  Psychiatric/Behavioral:  Negative for agitation and behavioral problems.        Objective:    Physical Exam Vitals reviewed.  Constitutional:      General: He is not in acute distress.    Appearance: He is not diaphoretic.  HENT:     Head: Normocephalic and atraumatic.     Nose: Nose normal.     Mouth/Throat:     Mouth: Mucous membranes are moist.  Eyes:     General: No scleral icterus.    Extraocular Movements: Extraocular movements intact.  Cardiovascular:     Rate and Rhythm: Normal rate and regular rhythm.     Heart sounds: Normal heart sounds. No murmur heard. Pulmonary:     Breath sounds: Normal breath sounds. No wheezing or rales.  Musculoskeletal:        General: Swelling (MCP, PIP and DIP joints of left hand - Mild) present.     Cervical back: Neck supple. No tenderness.     Lumbar back: No tenderness. Decreased range of motion.     Right lower leg: No edema.     Left lower leg: No edema.     Comments: ROM at hip normal  Skin:    General: Skin is warm.     Findings: No rash.     Comments: Erythematous papule over  scalp  Neurological:     General: No focal deficit present.     Mental Status: He is alert and oriented to person, place, and time.     Sensory: No sensory deficit.     Motor: No weakness.  Psychiatric:        Mood and Affect: Mood normal.        Behavior: Behavior normal.     BP 134/80 (BP Location: Left Arm, Patient Position: Sitting, Cuff Size: Normal)   Pulse (!) 59   Ht 5\' 8"  (1.727 m)   Wt 143 lb 6.4 oz (65 kg)   SpO2 96%   BMI 21.80 kg/m  Wt Readings from Last 3 Encounters:  04/21/23 143 lb 6.4 oz (65 kg)  04/20/23 138 lb (62.6 kg)  03/24/23 138 lb 12.8 oz (63 kg)        Assessment & Plan:   Problem List Items Addressed This Visit       Nervous and Auditory   Lumbar radiculopathy - Primary    His left buttock area pain likely radiating pain from lumbar spine Checked x-ray lumbar spine in 2023, he asked for repeat x-ray lumbar spine, but it would not be beneficial to show major changes, he prefers to avoid MRI of lumbar spine or PT Could be lumbar spinal stenosis as he has relief of pain with bending Tylenol or Naproxen as needed for pain - needs to take naproxen with meal and alternate with Tylenol Simple back exercises material provided If persistent hip area pain, may need x-ray of hip and/or orthopedic surgery evaluation      Relevant Medications   naproxen (NAPROSYN) 500 MG tablet     Other  Iron deficiency anemia due to chronic blood loss    Hb stable, ~12 On iron supplements Denies melena or hematochezia If drop in Hb noted, need to stop Naproxen        Meds ordered this encounter  Medications   naproxen (NAPROSYN) 500 MG tablet    Sig: Take 1 tablet (500 mg total) by mouth daily.    Dispense:  30 tablet    Refill:  2     Chelsea Pedretti Concha Se, MD

## 2023-04-21 NOTE — Patient Instructions (Signed)
Please take Naproxen as prescribed. Please alternate with Tylenol arthritis for back pain.

## 2023-04-21 NOTE — Assessment & Plan Note (Addendum)
His left buttock area pain likely radiating pain from lumbar spine Checked x-ray lumbar spine in 2023, he asked for repeat x-ray lumbar spine, but it would not be beneficial to show major changes, he prefers to avoid MRI of lumbar spine or PT Could be lumbar spinal stenosis as he has relief of pain with bending Tylenol or Naproxen as needed for pain - needs to take naproxen with meal and alternate with Tylenol Simple back exercises material provided If persistent hip area pain, may need x-ray of hip and/or orthopedic surgery evaluation

## 2023-04-21 NOTE — Assessment & Plan Note (Signed)
Hb stable, ~12 On iron supplements Denies melena or hematochezia If drop in Hb noted, need to stop Naproxen

## 2023-06-16 ENCOUNTER — Encounter: Payer: Self-pay | Admitting: Internal Medicine

## 2023-06-16 ENCOUNTER — Ambulatory Visit (INDEPENDENT_AMBULATORY_CARE_PROVIDER_SITE_OTHER): Payer: Medicare Other | Admitting: Internal Medicine

## 2023-06-16 VITALS — BP 132/88 | HR 73 | Ht 68.0 in | Wt 148.0 lb

## 2023-06-16 DIAGNOSIS — J439 Emphysema, unspecified: Secondary | ICD-10-CM

## 2023-06-16 DIAGNOSIS — I7121 Aneurysm of the ascending aorta, without rupture: Secondary | ICD-10-CM

## 2023-06-16 DIAGNOSIS — I10 Essential (primary) hypertension: Secondary | ICD-10-CM

## 2023-06-16 DIAGNOSIS — M5416 Radiculopathy, lumbar region: Secondary | ICD-10-CM | POA: Diagnosis not present

## 2023-06-16 DIAGNOSIS — R233 Spontaneous ecchymoses: Secondary | ICD-10-CM | POA: Insufficient documentation

## 2023-06-16 DIAGNOSIS — I700 Atherosclerosis of aorta: Secondary | ICD-10-CM | POA: Diagnosis not present

## 2023-06-16 DIAGNOSIS — Z23 Encounter for immunization: Secondary | ICD-10-CM

## 2023-06-16 NOTE — Assessment & Plan Note (Signed)
BP Readings from Last 1 Encounters:  06/16/23 132/88   Well-controlled with telmisartan 20 mg QD Needs to quit smoking Counseled for compliance with the medications Advised DASH diet and moderate exercise/walking, at least 150 mins/week

## 2023-06-16 NOTE — Assessment & Plan Note (Addendum)
Has noticed recent worsening of bruising Likely due to Naproxen, advised to decrease use of Naproxen and take Tylenol as needed for pain Check CBC

## 2023-06-16 NOTE — Progress Notes (Signed)
Established Patient Office Visit  Subjective:  Patient ID: Joshua Fuller, male    DOB: 08/24/1946  Age: 77 y.o. MRN: [id number]  CC:  Chief Complaint  Patient presents with   Hypertension    Six month follow up    Bleeding/Bruising    Patient states his arms are bruising more often     HPI Joshua Fuller is a 77 y.o. male with past medical history of COPD, depression, OCD, SCC of skin, erectile dysfunction and iron deficiency anemia who presents for follow up of his chronic medical conditions.  HTN: His BP is wnl today. He denies any headache, dizziness, chest pain or dyspnea.  He reports intermittent palpitations even in resting state, likely related to anxiety.  He has a history of OCD and MDD and takes Viibryd and Lamictal.  He smokes about a pack per day.  Reports recent worsening of bilateral arm bruising without injury.  Of note, he has been taking naproxen almost daily for low back pain.  Denies any melena or hematochezia.  He still takes iron supplement for IDA.  Past Medical History:  Diagnosis Date   Allergy    penicillin   Anemia    Phreesia 03/09/2020   Anxiety    Aortic atherosclerosis (HCC)    Arthritis    Some on right wrist   Blood transfusion without reported diagnosis    Dr. Juanetta Gosling ordered blood transfusions years ago   Cancer [facility])    Skin cancer   Chronic mental illness    OCD   COPD (chronic obstructive pulmonary disease) (HCC)    That's what I'm told   Depression    Phreesia 03/09/2020   Helicobacter pylori gastritis 08/29/2013   TOOK PYLERA    History of MRSA infection    of the skin   Hypertension    Phreesia 03/09/2020   IDA (iron deficiency anemia)    Ileitis 11/06/2013   Neurofibroma of abdominal wall     Past Surgical History:  Procedure Laterality Date   ANKLE SURGERY     APPENDECTOMY N/A    Phreesia 03/09/2020   Capsule endoscopy  02/19/2007   WUJ:WJXBJYN view of gastric mucosa/Normal small bowel mucosa    COLONOSCOPY  02/02/2007   SLF:Two 4-mm to 6-mm rectal polyps/sigmoid colon diverticulosis and internal hemorrhoids. hyperplastic polyps.    COLONOSCOPY N/A 03/22/2013   SLF: ANEMIA MOST LIKELY DUE TO ULCERS IN terminal ileum/Moderate diverticulosis  in the sigmoid colon/ONE RECTAL polyp removed(hyperplastic). ileum bx with focal active ileitis. next TCS 03/2023 with overtube   COLONOSCOPY, ESOPHAGOGASTRODUODENOSCOPY (EGD) AND ESOPHAGEAL DILATION N/A 03/22/2013   WGN:FAOZHYQMV DUE Stricture at the gastroesophageal junction/Large hiatal hernia/MILD Non-erosive gastritis & DUODENITIS (+h.pylori)   DENTAL SURGERY  02/2020   ESOPHAGOGASTRODUODENOSCOPY  02/02/2007   HQI:ONGEX hiatal hernia closely examined and no evidence of Cameron ulcers.  Otherwise, normal esophagus. 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   TONSILLECTOMY      Family History  Problem Relation Age of Onset   Lung disease Mother    Obesity Father    Colon cancer Neg Hx     Social History   Socioeconomic History   Marital status: Married    Spouse name: Not on file   Number of children: 4   Years of education: Not on file   Highest education level: Not on file  Occupational History   Occupation: Economist  electric    Employer: [employer]  Tobacco Use   Smoking status: Every Day    Current packs/day: 1.00    Average packs/day: 1 pack/day for 20.0 years (20.0 ttl pk-yrs)    Types: Cigarettes   Smokeless tobacco: Never  Vaping Use   Vaping status: Never Used  Substance and Sexual Activity   Alcohol use: Yes    Alcohol/week: 1.0 standard drink of alcohol    Types: 1 Glasses of wine per week    Comment: Couple glasses of wine per month    Drug use: Yes    Types: Marijuana    Comment: Daily as needed   Sexual activity: Not Currently    Birth control/protection: None  Other Topics Concern   Not on file  Social History Narrative   Retired from [employer]     Social Determinants of Health   Financial Resource Strain: Low Risk  (04/13/2022)   Overall Financial Resource Strain (CARDIA)    Difficulty of Paying Living Expenses: Not hard at all  Food Insecurity: No Food Insecurity (04/20/2023)   Hunger Vital Sign    Worried About Running Out of Food in the Last Year: Never true    Ran Out of Food in the Last Year: Never true  Transportation Needs: No Transportation Needs (04/20/2023)   PRAPARE - Administrator, Civil Service (Medical): No    Lack of Transportation (Non-Medical): No  Physical Activity: Inactive (04/20/2023)   Exercise Vital Sign    Days of Exercise per Week: 0 days    Minutes of Exercise per Session: 0 min  Stress: No Stress Concern Present (04/20/2023)   [employer] of Occupational Health - Occupational Stress Questionnaire    Feeling of Stress : Not at all  Social Connections: Moderately Integrated (04/20/2023)   Social Connection and Isolation Panel [NHANES]    Frequency of Communication with Friends and Family: More than three times a week    Frequency of Social Gatherings with Friends and Family: More than three times a week    Attends Religious Services: More than 4 times per year    Active Member of [employer] or Organizations: No    Attends Banker Meetings: Never    Marital Status: Married  Catering manager Violence: Not At Risk (04/20/2023)   Humiliation, Afraid, Rape, and Kick questionnaire    Fear of Current or Ex-Partner: No    Emotionally Abused: No    Physically Abused: No    Sexually Abused: No    Outpatient Medications Prior to Visit  Medication Sig Dispense Refill   Cholecalciferol (VITAMIN D3 PO) Take 1 tablet by mouth daily.     ferrous sulfate 325 (65 FE) MG EC tablet Take 325 mg by mouth daily with breakfast.     lamoTRIgine (LAMICTAL) 100 MG tablet Take 100 mg by mouth daily.      Multiple Vitamin (MULTIVITAMIN) capsule Take 1 capsule by mouth daily.     naproxen (NAPROSYN) 500 MG  tablet Take 1 tablet (500 mg total) by mouth daily. 30 tablet 2   sildenafil (REVATIO) 20 MG tablet Take 1 tablet (20 mg total) by mouth daily. Take 1-5 tablets as needed 90 tablet 3   telmisartan (MICARDIS) 20 MG tablet TAKE 1 TABLET DAILY 90 tablet 3   Vilazodone HCl (VIIBRYD) 40 MG TABS Take 20 mg by mouth daily.      No facility-administered medications prior to visit.    Allergies  Allergen Reactions   Penicillins  Rash    Has patient had a PCN reaction causing immediate rash, facial/tongue/throat swelling, SOB or lightheadedness with hypotension: yes Has patient had a PCN reaction causing severe rash involving mucus membranes or skin necrosis: no Has patient had a PCN reaction that required hospitalization: no Has patient had a PCN reaction occurring within the last 10 years: no If all of the above answers are "NO", then may proceed with Cephalosporin use.     ROS Review of Systems  Constitutional:  Negative for chills and fever.  HENT:  Negative for congestion and sore throat.   Eyes:  Negative for pain and discharge.  Respiratory:  Negative for cough and shortness of breath.   Cardiovascular:  Positive for palpitations. Negative for chest pain.  Gastrointestinal:  Negative for diarrhea, nausea and vomiting.  Endocrine: Negative for polydipsia and polyuria.  Genitourinary:  Negative for dysuria and hematuria.  Musculoskeletal:  Positive for arthralgias and back pain. Negative for neck pain and neck stiffness.  Skin:  Negative for rash.  Neurological:  Negative for dizziness, weakness, numbness and headaches.  Psychiatric/Behavioral:  Positive for sleep disturbance. Negative for agitation, behavioral problems, hallucinations and suicidal ideas. The patient is nervous/anxious.       Objective:    Physical Exam Vitals reviewed.  Constitutional:      General: He is not in acute distress.    Appearance: He is not diaphoretic.  HENT:     Head: Normocephalic and atraumatic.      Nose: Nose normal.     Mouth/Throat:     Mouth: Mucous membranes are moist.  Eyes:     General: No scleral icterus.    Extraocular Movements: Extraocular movements intact.  Cardiovascular:     Rate and Rhythm: Normal rate and regular rhythm.     Pulses: Normal pulses.     Heart sounds: Normal heart sounds. No murmur heard. Pulmonary:     Breath sounds: Normal breath sounds. No wheezing or rales.  Musculoskeletal:        General: Swelling (MCP, PIP and DIP joints of left hand - Mild) present.     Cervical back: Neck supple. No tenderness.     Right lower leg: No edema.     Left lower leg: No edema.  Skin:    General: Skin is warm.     Findings: Bruising (B/l arms) present. No rash.     Comments: Erythematous papule over scalp  Neurological:     General: No focal deficit present.     Mental Status: He is alert and oriented to person, place, and time.     Sensory: No sensory deficit.     Motor: No weakness.  Psychiatric:        Mood and Affect: Mood normal.        Behavior: Behavior normal.     BP 132/88 (BP Location: Left Arm, Patient Position: Sitting, Cuff Size: Normal)   Pulse 73   Ht 5\' 8"  (1.727 m)   Wt 148 lb (67.1 kg)   SpO2 98%   BMI 22.50 kg/m  Wt Readings from Last 3 Encounters:  06/16/23 148 lb (67.1 kg)  04/21/23 143 lb 6.4 oz (65 kg)  04/20/23 138 lb (62.6 kg)    Lab Results  Component Value Date   TSH 2.270 09/14/2022   Lab Results  Component Value Date   WBC 6.7 09/14/2022   HGB 12.6 (L) 09/14/2022   HCT 35.7 (L) 09/14/2022   MCV 98 (H) 09/14/2022  PLT 211 09/14/2022   Lab Results  Component Value Date   NA 141 09/14/2022   K 4.4 09/14/2022   CO2 23 09/14/2022   GLUCOSE 97 09/14/2022   BUN 19 09/14/2022   CREATININE 0.99 09/14/2022   BILITOT 0.3 09/14/2022   ALKPHOS 77 09/14/2022   AST 20 09/14/2022   ALT 17 09/14/2022   PROT 6.8 09/14/2022   ALBUMIN 4.7 09/14/2022   CALCIUM 9.1 09/14/2022   EGFR 79 09/14/2022   Lab Results   Component Value Date   CHOL 187 09/14/2022   Lab Results  Component Value Date   HDL 53 09/14/2022   Lab Results  Component Value Date   LDLCALC 123 (H) 09/14/2022   Lab Results  Component Value Date   TRIG 57 09/14/2022   Lab Results  Component Value Date   CHOLHDL 3.5 09/14/2022   Lab Results  Component Value Date   HGBA1C 5.4 09/14/2022      Assessment & Plan:   Problem List Items Addressed This Visit       Cardiovascular and Mediastinum   Essential hypertension - Primary    BP Readings from Last 1 Encounters:  06/16/23 132/88   Well-controlled with telmisartan 20 mg QD Needs to quit smoking Counseled for compliance with the medications Advised DASH diet and moderate exercise/walking, at least 150 mins/week      Relevant Orders   CBC with Differential/Platelet   Basic Metabolic Panel (BMET)   Aortic atherosclerosis (HCC)    Denies taking statin BP wnl now      Ascending aortic aneurysm (HCC)    Noted on CT chest Needs follow up CTA chest - will schedule in 01/25        Respiratory   Emphysema of lung (HCC)    No breathing problems currently Emphysema noted on CT chest        Nervous and Auditory   Lumbar radiculopathy    Checked x-ray lumbar spine in 2023, he prefers to avoid MRI of lumbar spine or PT Could be lumbar spinal stenosis as he has relief of pain with bending Tylenol or Naproxen as needed for pain - needs to take naproxen less frequently and alternate with Tylenol Simple back exercises material provided If persistent hip area pain, may need x-ray of hip and/or orthopedic surgery evaluation        Other   Easy bruising    Has noticed recent worsening of bruising Likely due to Naproxen, advised to decrease use of Naproxen and take Tylenol as needed for pain Check CBC      Relevant Orders   CBC with Differential/Platelet   Other Visit Diagnoses     Encounter for immunization       Relevant Orders   Flu Vaccine  Trivalent High Dose (Fluad) (Completed)       No orders of the defined types were placed in this encounter.   Follow-up: Return in about 6 months (around 12/15/2023) for HTN.    Anabel Halon, MD

## 2023-06-16 NOTE — Assessment & Plan Note (Signed)
Denies taking statin BP wnl now

## 2023-06-16 NOTE — Assessment & Plan Note (Signed)
Checked x-ray lumbar spine in 2023, he prefers to avoid MRI of lumbar spine or PT Could be lumbar spinal stenosis as he has relief of pain with bending Tylenol or Naproxen as needed for pain - needs to take naproxen less frequently and alternate with Tylenol Simple back exercises material provided If persistent hip area pain, may need x-ray of hip and/or orthopedic surgery evaluation

## 2023-06-16 NOTE — Patient Instructions (Signed)
Please continue to take medications as prescribed. ? ?Please continue to follow low salt diet and ambulate as tolerated. ?

## 2023-06-16 NOTE — Assessment & Plan Note (Signed)
Noted on CT chest Needs follow up CTA chest - will schedule in 01/25

## 2023-06-16 NOTE — Assessment & Plan Note (Signed)
No breathing problems currently Emphysema noted on CT chest 

## 2023-06-17 LAB — BASIC METABOLIC PANEL
BUN/Creatinine Ratio: 19 (ref 10–24)
BUN: 19 mg/dL (ref 8–27)
CO2: 26 mmol/L (ref 20–29)
Calcium: 8.9 mg/dL (ref 8.6–10.2)
Chloride: 102 mmol/L (ref 96–106)
Creatinine, Ser: 1.02 mg/dL (ref 0.76–1.27)
Glucose: 93 mg/dL (ref 70–99)
Potassium: 5.3 mmol/L — ABNORMAL HIGH (ref 3.5–5.2)
Sodium: 140 mmol/L (ref 134–144)
eGFR: 76 mL/min/{1.73_m2} (ref >59)

## 2023-06-17 LAB — CBC WITH DIFFERENTIAL/PLATELET
Basophils Absolute: 0 10*3/uL (ref 0.0–0.2)
Basos: 1 %
EOS (ABSOLUTE): 1 10*3/uL — ABNORMAL HIGH (ref 0.0–0.4)
Eos: 18 %
Hematocrit: 36.9 % — ABNORMAL LOW (ref 37.5–51.0)
Hemoglobin: 11.8 g/dL — ABNORMAL LOW (ref 13.0–17.7)
Immature Grans (Abs): 0 10*3/uL (ref 0.0–0.1)
Immature Granulocytes: 0 %
Lymphocytes Absolute: 1.7 10*3/uL (ref 0.7–3.1)
Lymphs: 30 %
MCH: 30.2 pg (ref 26.6–33.0)
MCHC: 32 g/dL (ref 31.5–35.7)
MCV: 94 fL (ref 79–97)
Monocytes Absolute: 0.3 10*3/uL (ref 0.1–0.9)
Monocytes: 6 %
Neutrophils Absolute: 2.6 10*3/uL (ref 1.4–7.0)
Neutrophils: 45 %
Platelets: 219 10*3/uL (ref 150–450)
RBC: 3.91 x10E6/uL — ABNORMAL LOW (ref 4.14–5.80)
RDW: 13 % (ref 11.6–15.4)
WBC: 5.6 10*3/uL (ref 3.4–10.8)

## 2023-06-22 ENCOUNTER — Encounter (INDEPENDENT_AMBULATORY_CARE_PROVIDER_SITE_OTHER): Payer: Self-pay | Admitting: Gastroenterology

## 2023-06-22 ENCOUNTER — Ambulatory Visit (INDEPENDENT_AMBULATORY_CARE_PROVIDER_SITE_OTHER): Payer: Medicare Other | Admitting: Gastroenterology

## 2023-06-22 VITALS — BP 136/83 | HR 66 | Temp 97.8°F | Ht 68.0 in | Wt 145.2 lb

## 2023-06-22 DIAGNOSIS — R194 Change in bowel habit: Secondary | ICD-10-CM

## 2023-06-22 NOTE — Patient Instructions (Addendum)
Schedule colonoscopy Avoid  NSAIDs like naproxen if possible - better take Tylenol as needed

## 2023-06-22 NOTE — Progress Notes (Signed)
Joshua Fuller, M.D. Gastroenterology & Hepatology [facility] Gastroenterology [address] [place], [place] [address] Primary Care Physician: Anabel Halon, MD [address] [place] [address]  Referring MD: PCP  Chief Complaint: Changes in bowel habits  History of Present Illness: Joshua Fuller is a 77 y.o. male  with PMH HTN, IDA on oral supplementation, GERD, depression and unclear history of Crohn's ileitis, who presents for evaluation of changes in bowel habits.  Patient reports feeling relatively well.  He states that when he eats chocolate ice cream or portato chips, or "junk food", he will notice his stools are soft. No problems when he takes milk or other dairy products. He has a BM every day. Color of the stool may vary, but usually is brown colored. The patient denies having any nausea, vomiting, fever, chills, hematochezia, melena, hematemesis, abdominal distention, abdominal pain, diarrhea, jaundice, pruritus. Has been gaining some weight recently.  Most recent labs from 06/16/2023 showed hemoglobin of 11.8 with MCV of 94, rest of cell lines within normal limits, BMPShowed a potassium of 5.3 with rest of electrolytes within normal limits, creatinine was 1.02 and BUN was 19  He is still taking ferrous sulfate 325 mg qday. He   Reports that he has been taking Tylenol and naproxen for the last 3 months for back pain. last dose of naproxen was 3 days ago, he is trying to decrease the naproxen.  The patient was seen in our office in 03/2020.  At that time he was complaining of acute onset of diarrhea, which was a self limited episode of gastroenteritis.  He was advised to increase the intake of Benefiber and to repeat screening colonoscopy in 3 years.  Of note, the patient is a former patient of Dr. Darrick Penna. He had a history of watery bowel movements in the past. He had previous endoscopic investigations which were suggestive of terminal ileum  Crohn's but pathology was not 100% conclusive of this. It was felt that his symptoms could be related to NSAID use. However, given positive IBD serology panel in the past discussion was held with the patient regarding the use of medications to control his Crohn's disease. However, the patient declined to undergo any treatment as he was asymptomatic and actually remained asymptomatic until his most recent presentation this year.   Last EGD: 2014, found to have a stricture at the GE junction, this was dilated with a Savary dilator up to 16 mm.  Stomach showed mild nonerosive gastropathy with multiple biopsies taken.  Duodenum showed duodenitis.  The patient was found to have H. pylori in stomach biopsies but no gastrointestinal metaplasia, dysplasia or malignancy. Received Pylera for this.   Last Colonoscopy: 2014-multiple ulcers were present in the terminal ileum, the scope was advanced up to 20 cm from the valve, presence of moderate diverticulosis in the sigmoid colon.  There was a 4 mm polyp in the rectum.  Ileal pathology showed focal ileitis but no dysplasia.  Rectal polyp was hyperplastic.   FHx: neg for any gastrointestinal/liver disease, no malignancies Social: smokes 1 pack a day, neg regular alcohol,  Surgical: appendectomy  Past Medical History: Past Medical History:  Diagnosis Date   Allergy    penicillin   Anemia    Phreesia 03/09/2020   Anxiety    Aortic atherosclerosis (HCC)    Arthritis    Some on right wrist   Blood transfusion without reported diagnosis    Dr. Juanetta Gosling ordered blood transfusions years  ago   Cancer [facility])    Skin cancer   Chronic mental illness    OCD   COPD (chronic obstructive pulmonary disease) (HCC)    That's what I'm told   Depression    Phreesia 03/09/2020   Helicobacter pylori gastritis 08/29/2013   TOOK PYLERA    History of MRSA infection    of the skin   Hypertension    Phreesia 03/09/2020   IDA (iron deficiency anemia)    Ileitis  11/06/2013   Neurofibroma of abdominal wall     Past Surgical History: Past Surgical History:  Procedure Laterality Date   ANKLE SURGERY     APPENDECTOMY N/A    Phreesia 03/09/2020   Capsule endoscopy  02/19/2007   ZOX:WRUEAVW view of gastric mucosa/Normal small bowel mucosa   COLONOSCOPY  02/02/2007   SLF:Two 4-mm to 6-mm rectal polyps/sigmoid colon diverticulosis and internal hemorrhoids. hyperplastic polyps.    COLONOSCOPY N/A 03/22/2013   SLF: ANEMIA MOST LIKELY DUE TO ULCERS IN terminal ileum/Moderate diverticulosis  in the sigmoid colon/ONE RECTAL polyp removed(hyperplastic). ileum bx with focal active ileitis. next TCS 03/2023 with overtube   COLONOSCOPY, ESOPHAGOGASTRODUODENOSCOPY (EGD) AND ESOPHAGEAL DILATION N/A 03/22/2013   UJW:JXBJYNWGN DUE Stricture at the gastroesophageal junction/Large hiatal hernia/MILD Non-erosive gastritis & DUODENITIS (+h.pylori)   DENTAL SURGERY  02/2020   ESOPHAGOGASTRODUODENOSCOPY  02/02/2007   FAO:ZHYQM hiatal hernia closely examined and no evidence of Cameron ulcers.  Otherwise, normal esophagus. 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   TONSILLECTOMY      Family History: Family History  Problem Relation Age of Onset   Lung disease Mother    Obesity Father    Colon cancer Neg Hx     Social History: Social History   Tobacco Use  Smoking Status Every Day   Current packs/day: 1.00   Average packs/day: 1 pack/day for 20.0 years (20.0 ttl pk-yrs)   Types: Cigarettes  Smokeless Tobacco Never   Social History   Substance and Sexual Activity  Alcohol Use Yes   Alcohol/week: 1.0 standard drink of alcohol   Types: 1 Glasses of wine per week   Comment: Couple glasses of wine per month    Social History   Substance and Sexual Activity  Drug Use Yes   Types: Marijuana   Comment: Daily as needed    Allergies: Allergies  Allergen Reactions   Penicillins Rash    Has  patient had a PCN reaction causing immediate rash, facial/tongue/throat swelling, SOB or lightheadedness with hypotension: yes Has patient had a PCN reaction causing severe rash involving mucus membranes or skin necrosis: no Has patient had a PCN reaction that required hospitalization: no Has patient had a PCN reaction occurring within the last 10 years: no If all of the above answers are "NO", then may proceed with Cephalosporin use.     Medications: Current Outpatient Medications  Medication Sig Dispense Refill   acetaminophen (TYLENOL) 500 MG tablet Take 500 mg by mouth daily at 6 (six) AM.     ferrous sulfate 325 (65 FE) MG EC tablet Take 325 mg by mouth daily with breakfast.     lamoTRIgine (LAMICTAL) 100 MG tablet Take 100 mg by mouth daily.      Multiple Vitamin (MULTIVITAMIN) capsule Take 1 capsule by mouth daily.     telmisartan (MICARDIS) 20 MG tablet TAKE 1 TABLET DAILY 90 tablet 3   Vilazodone HCl (  VIIBRYD) 40 MG TABS Take 40 mg by mouth daily.     No current facility-administered medications for this visit.    Review of Systems: GENERAL: negative for malaise, night sweats HEENT: No changes in hearing or vision, no nose bleeds or other nasal problems. NECK: Negative for lumps, goiter, pain and significant neck swelling RESPIRATORY: Negative for cough, wheezing CARDIOVASCULAR: Negative for chest pain, leg swelling, palpitations, orthopnea GI: SEE HPI MUSCULOSKELETAL: Negative for joint pain or swelling, back pain, and muscle pain. SKIN: Negative for lesions, rash PSYCH: Negative for sleep disturbance, mood disorder and recent psychosocial stressors. HEMATOLOGY Negative for prolonged bleeding, bruising easily, and swollen nodes. ENDOCRINE: Negative for cold or heat intolerance, polyuria, polydipsia and goiter. NEURO: negative for tremor, gait imbalance, syncope and seizures. The remainder of the review of systems is noncontributory.   Physical Exam: BP 136/83 (BP  Location: Left Arm, Patient Position: Sitting, Cuff Size: Normal)   Pulse 66   Temp 97.8 F (36.6 C) (Temporal)   Ht 5\' 8"  (1.727 m)   Wt 145 lb 3.2 oz (65.9 kg)   BMI 22.08 kg/m  GENERAL: The patient is AO x3, in no acute distress. HEENT: Head is normocephalic and atraumatic. EOMI are intact. Mouth is well hydrated and without lesions. NECK: Supple. No masses LUNGS: Clear to auscultation. No presence of rhonchi/wheezing/rales. Adequate chest expansion HEART: RRR, normal s1 and s2. ABDOMEN: Soft, nontender, no guarding, no peritoneal signs, and nondistended. BS +. No masses. EXTREMITIES: Without any cyanosis, clubbing, rash, lesions or edema. NEUROLOGIC: AOx3, no focal motor deficit. SKIN: no jaundice, no rashes   Imaging/Labs: as above  I personally reviewed and interpreted the available labs, imaging and endoscopic files.  Impression and Plan: Joshua Fuller is a 78 y.o. male  with PMH HTN, IDA on oral supplementation, GERD, depression and unclear history of Crohn's ileitis, who presents for evaluation of changes in bowel habits.  The patient has presented some changes in his bowel movement consistent as he, mostly related to the type of food that he is eating.  Denies having any other associated symptoms.  I advised the patient to avoid using NSAIDs, as this will lead to similar  problems with erosions in his colon as found in the past.    He is due for colorectal cancer screening, we will proceed with a colonoscopy.  -Schedule colonoscopy -Avoid  NSAIDs like naproxen if possible - better take Tylenol as needed  All questions were answered.      Joshua Blazing, MD Gastroenterology and Hepatology [facility] Gastroenterology

## 2023-06-28 ENCOUNTER — Telehealth (INDEPENDENT_AMBULATORY_CARE_PROVIDER_SITE_OTHER): Payer: Self-pay | Admitting: Gastroenterology

## 2023-06-28 MED ORDER — PEG 3350-KCL-NA BICARB-NACL 420 G PO SOLR: 4000.0000 mL | Freq: Once | ORAL | 0 refills | Status: AC

## 2023-06-28 NOTE — Telephone Encounter (Signed)
Pt returned call. Pt scheduled for 07/25/23 at 10 am. Prep sent to pharmacy. Instructions placed in mail.

## 2023-06-28 NOTE — Telephone Encounter (Signed)
Left message for pt to return call to schedule colonoscopy

## 2023-06-28 NOTE — Addendum Note (Signed)
Addended by: Marlowe Shores on: 06/28/2023 01:36 PM   Modules accepted: Orders

## 2023-07-25 ENCOUNTER — Ambulatory Visit (HOSPITAL_COMMUNITY)
Admission: RE | Admit: 2023-07-25 | Discharge: 2023-07-25 | Disposition: A | Discharge status: Home / Self Care | Payer: Medicare Other | Attending: Gastroenterology | Admitting: Gastroenterology

## 2023-07-25 ENCOUNTER — Ambulatory Visit (HOSPITAL_COMMUNITY): Payer: Medicare Other | Admitting: Anesthesiology

## 2023-07-25 ENCOUNTER — Encounter (HOSPITAL_COMMUNITY): Payer: Self-pay | Admitting: Gastroenterology

## 2023-07-25 ENCOUNTER — Other Ambulatory Visit: Payer: Self-pay

## 2023-07-25 ENCOUNTER — Encounter (HOSPITAL_COMMUNITY)
Admission: RE | Disposition: A | Discharge status: Home / Self Care | Payer: Self-pay | Source: Home / Self Care | Attending: Gastroenterology

## 2023-07-25 DIAGNOSIS — K5730 Diverticulosis of large intestine without perforation or abscess without bleeding: Secondary | ICD-10-CM | POA: Diagnosis not present

## 2023-07-25 DIAGNOSIS — J449 Chronic obstructive pulmonary disease, unspecified: Secondary | ICD-10-CM | POA: Diagnosis not present

## 2023-07-25 DIAGNOSIS — D122 Benign neoplasm of ascending colon: Secondary | ICD-10-CM | POA: Insufficient documentation

## 2023-07-25 DIAGNOSIS — Z860102 Personal history of hyperplastic colon polyps: Secondary | ICD-10-CM | POA: Diagnosis not present

## 2023-07-25 DIAGNOSIS — I10 Essential (primary) hypertension: Secondary | ICD-10-CM | POA: Insufficient documentation

## 2023-07-25 DIAGNOSIS — D509 Iron deficiency anemia, unspecified: Secondary | ICD-10-CM | POA: Insufficient documentation

## 2023-07-25 DIAGNOSIS — K644 Residual hemorrhoidal skin tags: Secondary | ICD-10-CM | POA: Diagnosis not present

## 2023-07-25 DIAGNOSIS — Z1211 Encounter for screening for malignant neoplasm of colon: Secondary | ICD-10-CM

## 2023-07-25 DIAGNOSIS — Z79899 Other long term (current) drug therapy: Secondary | ICD-10-CM | POA: Insufficient documentation

## 2023-07-25 DIAGNOSIS — D126 Benign neoplasm of colon, unspecified: Secondary | ICD-10-CM

## 2023-07-25 DIAGNOSIS — F17210 Nicotine dependence, cigarettes, uncomplicated: Secondary | ICD-10-CM | POA: Diagnosis not present

## 2023-07-25 DIAGNOSIS — F32A Depression, unspecified: Secondary | ICD-10-CM | POA: Insufficient documentation

## 2023-07-25 DIAGNOSIS — Z139 Encounter for screening, unspecified: Secondary | ICD-10-CM | POA: Diagnosis not present

## 2023-07-25 DIAGNOSIS — F17200 Nicotine dependence, unspecified, uncomplicated: Secondary | ICD-10-CM | POA: Diagnosis not present

## 2023-07-25 DIAGNOSIS — K635 Polyp of colon: Secondary | ICD-10-CM | POA: Diagnosis not present

## 2023-07-25 HISTORY — PX: COLONOSCOPY WITH PROPOFOL: SHX5780

## 2023-07-25 HISTORY — PX: POLYPECTOMY: SHX5525

## 2023-07-25 HISTORY — PX: SUBMUCOSAL LIFTING INJECTION: SHX6855

## 2023-07-25 HISTORY — PX: BIOPSY: SHX5522

## 2023-07-25 LAB — HM COLONOSCOPY

## 2023-07-25 SURGERY — COLONOSCOPY WITH PROPOFOL
Anesthesia: General

## 2023-07-25 MED ORDER — PROPOFOL 10 MG/ML IV BOLUS
INTRAVENOUS | Status: DC | PRN
  Administered 2023-07-25: 40 mg via INTRAVENOUS
  Administered 2023-07-25: 30 mg via INTRAVENOUS
  Administered 2023-07-25: 80 mg via INTRAVENOUS

## 2023-07-25 MED ORDER — LIDOCAINE HCL (PF) 2 % IJ SOLN
INTRAMUSCULAR | Status: AC
  Filled 2023-07-25: qty 5

## 2023-07-25 MED ORDER — PROPOFOL 500 MG/50ML IV EMUL
INTRAVENOUS | Status: DC | PRN
  Administered 2023-07-25: 150 ug/kg/min via INTRAVENOUS

## 2023-07-25 MED ORDER — LACTATED RINGERS IV SOLN: INTRAVENOUS | Status: DC | PRN

## 2023-07-25 MED ORDER — LIDOCAINE HCL (CARDIAC) PF 100 MG/5ML IV SOSY
PREFILLED_SYRINGE | INTRAVENOUS | Status: DC | PRN
  Administered 2023-07-25: 60 mg via INTRAVENOUS

## 2023-07-25 MED ORDER — PROPOFOL 500 MG/50ML IV EMUL
INTRAVENOUS | Status: AC
  Filled 2023-07-25: qty 50

## 2023-07-25 NOTE — Transfer of Care (Addendum)
Immediate Anesthesia Transfer of Care Note  Patient: Joshua Fuller  Procedure(s) Performed: COLONOSCOPY WITH PROPOFOL SUBMUCOSAL LIFTING INJECTION POLYPECTOMY BIOPSY  Patient Location: Endoscopy Unit  Anesthesia Type:General  Level of Consciousness: drowsy and patient cooperative  Airway & Oxygen Therapy: Patient Spontanous Breathing and Patient connected to nasal cannula oxygen  Post-op Assessment: Report given to RN and Post -op Vital signs reviewed and stable  Post vital signs: Reviewed and stable  Last Vitals:  Vitals Value Taken Time  BP 105/53 07/25/23   1036  Temp 36.6 07/25/23   1036  Pulse 55 07/25/23   1036  Resp 13 07/25/23   1036  SpO2 97% 07/25/23   1036    Last Pain:  Vitals:   07/25/23 0904  TempSrc: Oral  PainSc: 0-No pain      Patients Stated Pain Goal: 6 (07/25/23 0904)  Complications: No notable events documented.

## 2023-07-25 NOTE — Anesthesia Preprocedure Evaluation (Addendum)
Anesthesia Evaluation  Patient identified by MRN, date of birth, ID band Patient awake    Reviewed: Allergy & Precautions, H&P , NPO status , Patient's Chart, lab work & pertinent test results, reviewed documented beta blocker date and time   Airway Mallampati: II  TM Distance: >3 FB Neck ROM: full    Dental no notable dental hx. (+) Dental Advisory Given, Teeth Intact   Pulmonary COPD, Current Smoker   Pulmonary exam normal breath sounds clear to auscultation       Cardiovascular hypertension, Normal cardiovascular exam Rhythm:regular Rate:Normal  Aortic atherosclerosis   Neuro/Psych  PSYCHIATRIC DISORDERS Anxiety Depression    Neurofibroma abdominal wall  Neuromuscular disease    GI/Hepatic negative GI ROS, Neg liver ROS,,,  Endo/Other  negative endocrine ROS    Renal/GU negative Renal ROS  negative genitourinary   Musculoskeletal   Abdominal   Peds  Hematology negative hematology ROS (+)   Anesthesia Other Findings Cancer history.  MRSA history  Reproductive/Obstetrics negative OB ROS                             Anesthesia Physical Anesthesia Plan  ASA: 3  Anesthesia Plan: General   Post-op Pain Management: Minimal or no pain anticipated   Induction: Intravenous  PONV Risk Score and Plan: Propofol infusion  Airway Management Planned: Nasal Cannula and Natural Airway  Additional Equipment: None  Intra-op Plan:   Post-operative Plan:   Informed Consent: I have reviewed the patients History and Physical, chart, labs and discussed the procedure including the risks, benefits and alternatives for the proposed anesthesia with the patient or authorized representative who has indicated his/her understanding and acceptance.     Dental Advisory Given  Plan Discussed with: CRNA  Anesthesia Plan Comments:         Anesthesia Quick Evaluation

## 2023-07-25 NOTE — Op Note (Signed)
[facility] Patient Name: Joshua Fuller Procedure Date: 07/25/2023 9:43 AM MRN: [id number] Date of Birth: Aug 09, 1946 Attending MD: [name] , , [id number] CSN: [id number] Age: 76 Admit Type: Outpatient Procedure:                Colonoscopy Indications:              Screening for colorectal malignant neoplasm Providers:                [name], Nena Polio, RN, Zena Amos Referring MD:              Medicines:                Monitored Anesthesia Care Complications:            No immediate complications. Estimated Blood Loss:     Estimated blood loss: none. Procedure:                Pre-Anesthesia Assessment:                           - Prior to the procedure, a History and Physical                            was performed, and patient medications, allergies                            and sensitivities were reviewed. The patient's                            tolerance of previous anesthesia was reviewed.                           - The risks and benefits of the procedure and the                            sedation options and risks were discussed with the                            patient. All questions were answered and informed                            consent was obtained.                           - ASA Grade Assessment: II - A patient with mild                            systemic disease.                           After obtaining informed consent, the colonoscope                            was passed under direct vision. Throughout the                            procedure, the patient's blood  pressure, pulse, and                            oxygen saturations were monitored continuously. The                            [id number]-HQ190L ([id number]) scope was introduced through                            the anus and advanced to the the cecum, identified                            by appendiceal orifice and ileocecal valve. The                             colonoscopy was performed without difficulty. The                            patient tolerated the procedure well. The quality                            of the bowel preparation was excellent. Scope In: 10:03:34 AM Scope Out: 10:28:57 AM Scope Withdrawal Time: 0 hours 16 minutes 30 seconds  Total Procedure Duration: 0 hours 25 minutes 23 seconds  Findings:      External hemorrhoids were found on perianal exam.      A 1 mm polyp was found in the ascending colon. The polyp was sessile.       The polyp was removed with a cold biopsy forceps. Resection and       retrieval were complete.      An 8 mm polyp was found in the ascending colon. The polyp was sessile.       The polyp was removed with a cold snare. Resection and retrieval were       complete.      A 10 mm polyp was found in the ascending colon. The polyp was flat. Area       was successfully injected with 4 mL Eleview for a lift polypectomy.       Imaging was performed using white light and narrow band imaging to       visualize the mucosa and demarcate the polyp site after injection for       EMR purposes. The polyp was removed with a cold snare. Resection and       retrieval were complete.      Scattered large-mouthed and small-mouthed diverticula were found in the       sigmoid colon, descending colon and ascending colon.      The retroflexed view of the distal rectum and anal verge was normal and       showed no anal or rectal abnormalities. Impression:               - Hemorrhoids found on perianal exam.                           - One 1 mm polyp in the ascending colon, removed  with a cold biopsy forceps. Resected and retrieved.                           - One 8 mm polyp in the ascending colon, removed                            with a cold snare. Resected and retrieved.                           - One 10 mm polyp in the ascending colon, removed                            with a cold snare. Resected  and retrieved via EMR.                           - Diverticulosis in the sigmoid colon, in the                            descending colon and in the ascending colon.                           - The distal rectum and anal verge are normal on                            retroflexion view. Moderate Sedation:      Per Anesthesia Care Recommendation:           - Discharge patient to home (ambulatory).                           - Resume previous diet.                           - Await pathology results.                           - Repeat colonoscopy in 3 years for surveillance. Procedure Code(s):        --- Professional ---                           [id number], 59, Colonoscopy, flexible; with removal of                            tumor(s), polyp(s), or other lesion(s) by snare                            technique                           45380, 59, Colonoscopy, flexible; with biopsy,                            single or multiple  44010, Colonoscopy, flexible; with directed                            submucosal injection(s), any substance Diagnosis Code(s):        --- Professional ---                           Z12.11, Encounter for screening for malignant                            neoplasm of colon                           D12.2, Benign neoplasm of ascending colon                           K64.9, Unspecified hemorrhoids                           K57.30, Diverticulosis of large intestine without                            perforation or abscess without bleeding CPT copyright 2022 American [employer] Association. All rights reserved. The codes documented in this report are preliminary and upon coder review may  be revised to meet current compliance requirements. [name], MD [name],  07/25/2023 10:38:45 AM This report has been signed electronically. Number of Addenda: 0

## 2023-07-25 NOTE — H&P (Signed)
Joshua Fuller is an 77 y.o. male.   Chief Complaint: CRC screening HPI: Joshua Fuller is a 77 y.o. male  with PMH HTN, IDA on oral supplementation, GERD, depression and unclear history of Crohn's ileitis, who presents for colorectal cancer screening.  Last colonoscopy performed in 2014, had 1 hyperplastic polyp.  The patient denies having any complaints such as melena, hematochezia, abdominal pain or distention, change in her bowel movement consistency or frequency, no changes in weight recently.  No family history of colorectal cancer.  Past Medical History:  Diagnosis Date   Allergy    penicillin   Anemia    [employer] 03/09/2020   Anxiety    Aortic atherosclerosis (HCC)    Arthritis    Some on right wrist   Blood transfusion without reported diagnosis    Dr. Juanetta Gosling ordered blood transfusions years ago   Cancer [facility])    Skin cancer   Chronic mental illness    OCD   COPD (chronic obstructive pulmonary disease) (HCC)    That's what I'm told   Depression    [employer] 03/09/2020   Helicobacter pylori gastritis 08/29/2013   TOOK PYLERA    History of MRSA infection    of the skin   Hypertension    [employer] 03/09/2020   IDA (iron deficiency anemia)    Ileitis 11/06/2013   Neurofibroma of abdominal wall     Past Surgical History:  Procedure Laterality Date   ANKLE SURGERY     APPENDECTOMY N/A    [employer] 03/09/2020   Capsule endoscopy  02/19/2007   DGU:YQIHKVQ view of gastric mucosa/Normal small bowel mucosa   COLONOSCOPY  02/02/2007   SLF:Two 4-mm to 6-mm rectal polyps/sigmoid colon diverticulosis and internal hemorrhoids. hyperplastic polyps.    COLONOSCOPY N/A 03/22/2013   SLF: ANEMIA MOST LIKELY DUE TO ULCERS IN terminal ileum/Moderate diverticulosis  in the sigmoid colon/ONE RECTAL polyp removed(hyperplastic). ileum bx with focal active ileitis. next TCS 03/2023 with overtube   COLONOSCOPY, ESOPHAGOGASTRODUODENOSCOPY (EGD) AND ESOPHAGEAL DILATION N/A 03/22/2013    QVZ:DGLOVFIEP DUE Stricture at the gastroesophageal junction/Large hiatal hernia/MILD Non-erosive gastritis & DUODENITIS (+h.pylori)   DENTAL SURGERY  02/2020   ESOPHAGOGASTRODUODENOSCOPY  02/02/2007   PIR:JJOAC hiatal hernia closely examined and no evidence of Cameron ulcers.  Otherwise, normal esophagus. 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 He has a 20 pack-year smoking history. He has never used smokeless tobacco. He reports current alcohol use of about 1.0 standard drink of alcohol per week. He reports current drug use. Drug: Marijuana.  Allergies:  Allergies  Allergen Reactions   Penicillins Rash    Has patient had a PCN reaction causing immediate rash, facial/tongue/throat swelling, SOB or lightheadedness with hypotension: yes Has patient had a PCN reaction causing severe rash involving mucus membranes or skin necrosis: no Has patient had a PCN reaction that required hospitalization: no Has patient had a PCN reaction occurring within the last 10 years: no If all of the above answers are "NO", then may proceed with Cephalosporin use.     Medications Prior to Admission  Medication Sig Dispense Refill   acetaminophen (TYLENOL) 500 MG  tablet Take 500 mg by mouth daily at 6 (six) AM.     lamoTRIgine (LAMICTAL) 100 MG tablet Take 100 mg by mouth daily.      Multiple Vitamin (MULTIVITAMIN) capsule Take 1 capsule by mouth daily.     telmisartan (MICARDIS) 20 MG tablet TAKE 1 TABLET DAILY 90 tablet 3   Vilazodone HCl (VIIBRYD) 40 MG TABS Take 40 mg by mouth daily.     ferrous sulfate 325 (65 FE) MG EC tablet Take 325 mg by mouth daily with breakfast.      No results found for this or any previous visit (from the past 48  hour(s)). No results found.  Review of Systems  All other systems reviewed and are negative.   Blood pressure (!) 151/87, pulse 70, temperature 98.2 F (36.8 C), temperature source Oral, resp. rate 14, height 5\' 7"  (1.702 m), weight 65.3 kg, SpO2 100%. Physical Exam  GENERAL: The patient is AO x3, in no acute distress. HEENT: Head is normocephalic and atraumatic. EOMI are intact. Mouth is well hydrated and without lesions. NECK: Supple. No masses LUNGS: Clear to auscultation. No presence of rhonchi/wheezing/rales. Adequate chest expansion HEART: RRR, normal s1 and s2. ABDOMEN: Soft, nontender, no guarding, no peritoneal signs, and nondistended. BS +. No masses. EXTREMITIES: Without any cyanosis, clubbing, rash, lesions or edema. NEUROLOGIC: AOx3, no focal motor deficit. SKIN: no jaundice, no rashes  Assessment/Plan  Joshua Fuller is a 77 y.o. male  with PMH HTN, IDA on oral supplementation, GERD, depression and unclear history of Crohn's ileitis, who presents for colorectal cancer screening. The patient is at average risk for colorectal cancer.  We will proceed with colonoscopy today.  Dolores Frame, MD 07/25/2023, 9:42 AM

## 2023-07-25 NOTE — Anesthesia Postprocedure Evaluation (Signed)
Anesthesia Post Note  Patient: Joshua Fuller  Procedure(s) Performed: COLONOSCOPY WITH PROPOFOL SUBMUCOSAL LIFTING INJECTION POLYPECTOMY BIOPSY  Patient location during evaluation: PACU Anesthesia Type: General Level of consciousness: awake and alert Pain management: pain level controlled Vital Signs Assessment: post-procedure vital signs reviewed and stable Respiratory status: spontaneous breathing, nonlabored ventilation, respiratory function stable and patient connected to nasal cannula oxygen Cardiovascular status: blood pressure returned to baseline and stable Postop Assessment: no apparent nausea or vomiting Anesthetic complications: no   There were no known notable events for this encounter.   Last Vitals:  Vitals:   07/25/23 0904 07/25/23 1036  BP: (!) 151/87 (!) 105/53  Pulse: 70 (!) 55  Resp: 14 13  Temp: 36.8 C 36.6 C  SpO2: 100% 97%    Last Pain:  Vitals:   07/25/23 1036  TempSrc:   PainSc: 0-No pain                 [name] L [name]

## 2023-07-25 NOTE — Discharge Instructions (Signed)
You are being discharged to home.  Resume your previous diet.  We are waiting for your pathology results.  Your physician has recommended a repeat colonoscopy in three years for surveillance.  

## 2023-07-26 ENCOUNTER — Encounter (INDEPENDENT_AMBULATORY_CARE_PROVIDER_SITE_OTHER): Payer: Self-pay | Admitting: *Deleted

## 2023-07-27 ENCOUNTER — Encounter (INDEPENDENT_AMBULATORY_CARE_PROVIDER_SITE_OTHER): Payer: Self-pay | Admitting: *Deleted

## 2023-07-27 LAB — SURGICAL PATHOLOGY

## 2023-07-31 ENCOUNTER — Encounter (HOSPITAL_COMMUNITY): Payer: Self-pay | Admitting: Gastroenterology

## 2023-10-09 DIAGNOSIS — F39 Unspecified mood [affective] disorder: Secondary | ICD-10-CM | POA: Diagnosis not present

## 2023-10-09 DIAGNOSIS — N529 Male erectile dysfunction, unspecified: Secondary | ICD-10-CM | POA: Diagnosis not present

## 2023-10-09 DIAGNOSIS — F429 Obsessive-compulsive disorder, unspecified: Secondary | ICD-10-CM | POA: Diagnosis not present

## 2023-12-05 DIAGNOSIS — N529 Male erectile dysfunction, unspecified: Secondary | ICD-10-CM | POA: Diagnosis not present

## 2023-12-05 DIAGNOSIS — F39 Unspecified mood [affective] disorder: Secondary | ICD-10-CM | POA: Diagnosis not present

## 2023-12-05 DIAGNOSIS — F429 Obsessive-compulsive disorder, unspecified: Secondary | ICD-10-CM | POA: Diagnosis not present

## 2023-12-18 ENCOUNTER — Encounter: Payer: Self-pay | Admitting: Internal Medicine

## 2023-12-18 ENCOUNTER — Ambulatory Visit (INDEPENDENT_AMBULATORY_CARE_PROVIDER_SITE_OTHER): Payer: TRICARE For Life (TFL) | Admitting: Internal Medicine

## 2023-12-18 ENCOUNTER — Ambulatory Visit: Payer: TRICARE For Life (TFL) | Admitting: Internal Medicine

## 2023-12-18 VITALS — BP 138/82 | HR 84 | Ht 68.0 in | Wt 143.8 lb

## 2023-12-18 DIAGNOSIS — I7121 Aneurysm of the ascending aorta, without rupture: Secondary | ICD-10-CM | POA: Diagnosis not present

## 2023-12-18 DIAGNOSIS — R972 Elevated prostate specific antigen [PSA]: Secondary | ICD-10-CM | POA: Diagnosis not present

## 2023-12-18 DIAGNOSIS — J439 Emphysema, unspecified: Secondary | ICD-10-CM | POA: Diagnosis not present

## 2023-12-18 DIAGNOSIS — F3341 Major depressive disorder, recurrent, in partial remission: Secondary | ICD-10-CM | POA: Diagnosis not present

## 2023-12-18 DIAGNOSIS — F429 Obsessive-compulsive disorder, unspecified: Secondary | ICD-10-CM | POA: Diagnosis not present

## 2023-12-18 DIAGNOSIS — I700 Atherosclerosis of aorta: Secondary | ICD-10-CM

## 2023-12-18 DIAGNOSIS — R7303 Prediabetes: Secondary | ICD-10-CM

## 2023-12-18 DIAGNOSIS — E559 Vitamin D deficiency, unspecified: Secondary | ICD-10-CM | POA: Diagnosis not present

## 2023-12-18 DIAGNOSIS — I10 Essential (primary) hypertension: Secondary | ICD-10-CM | POA: Diagnosis not present

## 2023-12-18 NOTE — Assessment & Plan Note (Signed)
No breathing problems currently Emphysema noted on CT chest 

## 2023-12-18 NOTE — Assessment & Plan Note (Signed)
Had referred to Urology PSA stable Will recheck PSA

## 2023-12-18 NOTE — Progress Notes (Signed)
 Established Patient Office Visit  Subjective:  Patient ID: Joshua Fuller, male    DOB: 1945-11-18  Age: 78 y.o. MRN: [id number]  CC:  Chief Complaint  Patient presents with   Care Management    6 month f/u    HPI Joshua Fuller is a 78 y.o. male with past medical history of COPD, depression, OCD, SCC of skin, erectile dysfunction and iron deficiency anemia who presents for follow up of his chronic medical conditions.  HTN: His BP is wnl today. He denies any headache, dizziness, chest pain or dyspnea.  He reports intermittent palpitations even in resting state, likely related to anxiety.  He has a history of OCD and MDD and takes Viibryd and Lamictal.  He smokes about a pack per day.  Ascending aortic aneurysm: His previous CT chest in 01/24 showed 4.2 cm ascending aortic aneurysm, slightly increased from 3.8 cm in 2019.  He denies any chest pain, dyspnea currently.   Past Medical History:  Diagnosis Date   Allergy    penicillin   Anemia    Phreesia 03/09/2020   Anxiety    Aortic atherosclerosis (HCC)    Arthritis    Some on right wrist   Blood transfusion without reported diagnosis    Dr. Juanetta Gosling ordered blood transfusions years ago   Cancer [facility])    Skin cancer   Chronic mental illness    OCD   COPD (chronic obstructive pulmonary disease) (HCC)    That's what I'm told   Depression    Phreesia 03/09/2020   Helicobacter pylori gastritis 08/29/2013   TOOK PYLERA    History of MRSA infection    of the skin   Hypertension    Phreesia 03/09/2020   IDA (iron deficiency anemia)    Ileitis 11/06/2013   Neurofibroma of abdominal wall     Past Surgical History:  Procedure Laterality Date   ANKLE SURGERY     APPENDECTOMY N/A    Phreesia 03/09/2020   BIOPSY  07/25/2023   Procedure: BIOPSY;  Surgeon: Marguerita Merles, Reuel Boom, MD;  [facility];  Service: Gastroenterology;;   Capsule endoscopy  02/19/2007   GNF:AOZHYQM view of gastric mucosa/Normal  small bowel mucosa   COLONOSCOPY  02/02/2007   SLF:Two 4-mm to 6-mm rectal polyps/sigmoid colon diverticulosis and internal hemorrhoids. hyperplastic polyps.    COLONOSCOPY N/A 03/22/2013   SLF: ANEMIA MOST LIKELY DUE TO ULCERS IN terminal ileum/Moderate diverticulosis  in the sigmoid colon/ONE RECTAL polyp removed(hyperplastic). ileum bx with focal active ileitis. next TCS 03/2023 with overtube   COLONOSCOPY WITH PROPOFOL N/A 07/25/2023   Procedure: COLONOSCOPY WITH PROPOFOL;  Surgeon: Dolores Frame, MD;  [facility];  Service: Gastroenterology;  Laterality: N/A;  10:00AM;ASA 1   COLONOSCOPY, ESOPHAGOGASTRODUODENOSCOPY (EGD) AND ESOPHAGEAL DILATION N/A 03/22/2013   VHQ:IONGEXBMW DUE Stricture at the gastroesophageal junction/Large hiatal hernia/MILD Non-erosive gastritis & DUODENITIS (+h.pylori)   DENTAL SURGERY  02/2020   ESOPHAGOGASTRODUODENOSCOPY  02/02/2007   UXL:KGMWN hiatal hernia closely examined and no evidence of Cameron ulcers.  Otherwise, normal esophagus. 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   POLYPECTOMY  07/25/2023   Procedure: POLYPECTOMY;  Surgeon: Dolores Frame, MD;  [facility];  Service: Gastroenterology;;   SUBMUCOSAL LIFTING INJECTION  07/25/2023   Procedure: SUBMUCOSAL LIFTING INJECTION;  Surgeon: Dolores Frame, MD;  [facility];  Service: Gastroenterology;;  TONSILLECTOMY      Family History  Problem Relation Age of Onset   Lung disease Mother    Obesity Father    Colon cancer Neg Hx     Social History   Socioeconomic History   Marital status: Married    Spouse name: Not on file   Number of children: 4   Years of education: Not on file   Highest education level: Not on file  Occupational History   Occupation: Print production planner: Warden/ranger  Tobacco Use   Smoking status: Every Day    Current packs/day:  1.00    Average packs/day: 1 pack/day for 20.0 years (20.0 ttl pk-yrs)    Types: Cigarettes   Smokeless tobacco: Never   Tobacco comments:    10 cigarettes a day as of 12/18/23  Vaping Use   Vaping status: Never Used  Substance and Sexual Activity   Alcohol use: Yes    Alcohol/week: 1.0 standard drink of alcohol    Types: 1 Glasses of wine per week    Comment: Couple glasses of wine per month    Drug use: Yes    Types: Marijuana    Comment: Daily as needed   Sexual activity: Not Currently    Birth control/protection: None  Other Topics Concern   Not on file  Social History Narrative   Retired from [employer]    Social Drivers of Corporate investment banker Strain: Low Risk  (04/13/2022)   Overall Financial Resource Strain (CARDIA)    Difficulty of Paying Living Expenses: Not hard at all  Food Insecurity: No Food Insecurity (04/20/2023)   Hunger Vital Sign    Worried About Running Out of Food in the Last Year: Never true    Ran Out of Food in the Last Year: Never true  Transportation Needs: No Transportation Needs (04/20/2023)   PRAPARE - Administrator, Civil Service (Medical): No    Lack of Transportation (Non-Medical): No  Physical Activity: Inactive (04/20/2023)   Exercise Vital Sign    Days of Exercise per Week: 0 days    Minutes of Exercise per Session: 0 min  Stress: No Stress Concern Present (04/20/2023)   [employer] of Occupational Health - Occupational Stress Questionnaire    Feeling of Stress : Not at all  Social Connections: Moderately Integrated (04/20/2023)   Social Connection and Isolation Panel [NHANES]    Frequency of Communication with Friends and Family: More than three times a week    Frequency of Social Gatherings with Friends and Family: More than three times a week    Attends Religious Services: More than 4 times per year    Active Member of [employer] or Organizations: No    Attends Banker Meetings: Never    Marital Status: Married   Catering manager Violence: Not At Risk (04/20/2023)   Humiliation, Afraid, Rape, and Kick questionnaire    Fear of Current or Ex-Partner: No    Emotionally Abused: No    Physically Abused: No    Sexually Abused: No    Outpatient Medications Prior to Visit  Medication Sig Dispense Refill   acetaminophen (TYLENOL) 500 MG tablet Take 500 mg by mouth daily at 6 (six) AM.     ferrous sulfate 325 (65 FE) MG EC tablet Take 325 mg by mouth daily with breakfast.     lamoTRIgine (LAMICTAL) 100 MG tablet Take 100 mg by mouth daily.      Multiple Vitamin (  MULTIVITAMIN) capsule Take 1 capsule by mouth daily.     telmisartan (MICARDIS) 20 MG tablet TAKE 1 TABLET DAILY 90 tablet 3   Vilazodone HCl (VIIBRYD) 40 MG TABS Take 40 mg by mouth daily.     No facility-administered medications prior to visit.    Allergies  Allergen Reactions   Penicillins Rash    Has patient had a PCN reaction causing immediate rash, facial/tongue/throat swelling, SOB or lightheadedness with hypotension: yes Has patient had a PCN reaction causing severe rash involving mucus membranes or skin necrosis: no Has patient had a PCN reaction that required hospitalization: no Has patient had a PCN reaction occurring within the last 10 years: no If all of the above answers are "NO", then may proceed with Cephalosporin use.     ROS Review of Systems  Constitutional:  Negative for chills and fever.  HENT:  Negative for congestion and sore throat.   Eyes:  Negative for pain and discharge.  Respiratory:  Negative for cough and shortness of breath.   Cardiovascular:  Positive for palpitations. Negative for chest pain.  Gastrointestinal:  Negative for diarrhea, nausea and vomiting.  Endocrine: Negative for polydipsia and polyuria.  Genitourinary:  Negative for dysuria and hematuria.  Musculoskeletal:  Positive for arthralgias and back pain. Negative for neck pain and neck stiffness.  Skin:  Negative for rash.  Neurological:   Negative for dizziness, weakness, numbness and headaches.  Psychiatric/Behavioral:  Positive for sleep disturbance. Negative for agitation, behavioral problems, hallucinations and suicidal ideas. The patient is nervous/anxious.       Objective:    Physical Exam Vitals reviewed.  Constitutional:      General: He is not in acute distress.    Appearance: He is not diaphoretic.  HENT:     Head: Normocephalic and atraumatic.     Nose: Nose normal.     Mouth/Throat:     Mouth: Mucous membranes are moist.  Eyes:     General: No scleral icterus.    Extraocular Movements: Extraocular movements intact.  Cardiovascular:     Rate and Rhythm: Normal rate and regular rhythm.     Pulses: Normal pulses.     Heart sounds: Normal heart sounds. No murmur heard. Pulmonary:     Breath sounds: Normal breath sounds. No wheezing or rales.  Musculoskeletal:        General: Swelling (MCP, PIP and DIP joints of left hand - Mild) present.     Cervical back: Neck supple. No tenderness.     Right lower leg: No edema.     Left lower leg: No edema.  Skin:    General: Skin is warm.     Findings: Bruising (B/l arms) present. No rash.     Comments: Erythematous papule over scalp  Neurological:     General: No focal deficit present.     Mental Status: He is alert and oriented to person, place, and time.     Sensory: No sensory deficit.     Motor: No weakness.  Psychiatric:        Mood and Affect: Mood normal.        Behavior: Behavior normal.     BP 138/82 (BP Location: Left Arm)   Pulse 84   Ht 5\' 8"  (1.727 m)   Wt 143 lb 12.8 oz (65.2 kg)   SpO2 96%   BMI 21.86 kg/m  Wt Readings from Last 3 Encounters:  12/18/23 143 lb 12.8 oz (65.2 kg)  07/25/23 144 lb (65.3  kg)  06/22/23 145 lb 3.2 oz (65.9 kg)    Lab Results  Component Value Date   TSH 2.270 09/14/2022   Lab Results  Component Value Date   WBC 5.6 06/16/2023   HGB 11.8 (L) 06/16/2023   HCT 36.9 (L) 06/16/2023   MCV 94 06/16/2023    PLT 219 06/16/2023   Lab Results  Component Value Date   NA 140 06/16/2023   K 5.3 (H) 06/16/2023   CO2 26 06/16/2023   GLUCOSE 93 06/16/2023   BUN 19 06/16/2023   CREATININE 1.02 06/16/2023   BILITOT 0.3 09/14/2022   ALKPHOS 77 09/14/2022   AST 20 09/14/2022   ALT 17 09/14/2022   PROT 6.8 09/14/2022   ALBUMIN 4.7 09/14/2022   CALCIUM 8.9 06/16/2023   EGFR 76 06/16/2023   Lab Results  Component Value Date   CHOL 187 09/14/2022   Lab Results  Component Value Date   HDL 53 09/14/2022   Lab Results  Component Value Date   LDLCALC 123 (H) 09/14/2022   Lab Results  Component Value Date   TRIG 57 09/14/2022   Lab Results  Component Value Date   CHOLHDL 3.5 09/14/2022   Lab Results  Component Value Date   HGBA1C 5.4 09/14/2022      Assessment & Plan:   Problem List Items Addressed This Visit       Cardiovascular and Mediastinum   Essential hypertension - Primary   BP Readings from Last 1 Encounters:  12/18/23 138/82   Well-controlled with telmisartan 20 mg QD Needs to quit smoking Counseled for compliance with the medications Advised DASH diet and moderate exercise/walking, at least 150 mins/week      Relevant Orders   TSH   CMP14+EGFR   CBC with Differential/Platelet   Aortic atherosclerosis (HCC)   Denies taking statin BP wnl now      Relevant Orders   Lipid panel   Ascending aortic aneurysm (HCC)   Noted on CT chest - 4.2 cm in 01/24 Needs follow up CTA chest - ordered      Relevant Orders   CT ANGIO CHEST AORTA W/CM & OR WO/CM     Respiratory   Emphysema of lung (HCC)   No breathing problems currently Emphysema noted on CT chest      Relevant Orders   CMP14+EGFR   CBC with Differential/Platelet     Other   MDD (major depressive disorder)   Follows up with Psychiatry, also reports h/o OCD On Lamictal and Viibryd Has been more anxious lately due to recent political changes Needs to follow up with Psychiatry regularly       Relevant Orders   TSH   CMP14+EGFR   CBC with Differential/Platelet   Elevated PSA   Had referred to Urology PSA stable Will recheck PSA      Relevant Orders   PSA   Prediabetes   Lab Results  Component Value Date   HGBA1C 5.4 09/14/2022   Advised to follow DASH diet for now      Relevant Orders   Hemoglobin A1c   OCD (obsessive compulsive disorder)   Overall controlled with Viibryd and Lamictal Followed by psychiatry      Other Visit Diagnoses       Vitamin D deficiency       Relevant Orders   VITAMIN D 25 Hydroxy (Vit-D Deficiency, Fractures)        No orders of the defined types were placed in this encounter.  Follow-up: Return in about 6 months (around 06/18/2024) for HTN.    Anabel Halon, MD

## 2023-12-18 NOTE — Assessment & Plan Note (Addendum)
 Follows up with Psychiatry, also reports h/o OCD On Lamictal and Viibryd Has been more anxious lately due to recent political changes Needs to follow up with Psychiatry regularly

## 2023-12-18 NOTE — Assessment & Plan Note (Signed)
Denies taking statin BP wnl now

## 2023-12-18 NOTE — Assessment & Plan Note (Signed)
 Lab Results  Component Value Date   HGBA1C 5.4 09/14/2022   Advised to follow DASH diet for now

## 2023-12-18 NOTE — Assessment & Plan Note (Addendum)
 Noted on CT chest - 4.2 cm in 01/24 Needs follow up CTA chest - ordered

## 2023-12-18 NOTE — Assessment & Plan Note (Addendum)
 BP Readings from Last 1 Encounters:  12/18/23 138/82   Well-controlled with telmisartan 20 mg QD Needs to quit smoking Counseled for compliance with the medications Advised DASH diet and moderate exercise/walking, at least 150 mins/week

## 2023-12-18 NOTE — Patient Instructions (Addendum)
Please continue to take medications as prescribed.  Please continue to follow low salt diet and perform moderate exercise/walking as tolerated.  Please consider getting Shingrix and Tdap vaccine at local pharmacy.

## 2023-12-18 NOTE — Assessment & Plan Note (Signed)
 Overall controlled with Viibryd and Lamictal Followed by psychiatry

## 2023-12-19 ENCOUNTER — Encounter: Payer: Self-pay | Admitting: Internal Medicine

## 2023-12-19 LAB — CBC WITH DIFFERENTIAL/PLATELET
Basophils Absolute: 0.1 10*3/uL (ref 0.0–0.2)
Basos: 2 %
EOS (ABSOLUTE): 0.1 10*3/uL (ref 0.0–0.4)
Eos: 2 %
Hematocrit: 36.5 % — ABNORMAL LOW (ref 37.5–51.0)
Hemoglobin: 12.7 g/dL — ABNORMAL LOW (ref 13.0–17.7)
Immature Grans (Abs): 0 10*3/uL (ref 0.0–0.1)
Immature Granulocytes: 1 %
Lymphocytes Absolute: 1.8 10*3/uL (ref 0.7–3.1)
Lymphs: 28 %
MCH: 34.4 pg — ABNORMAL HIGH (ref 26.6–33.0)
MCHC: 34.8 g/dL (ref 31.5–35.7)
MCV: 99 fL — ABNORMAL HIGH (ref 79–97)
Monocytes Absolute: 0.5 10*3/uL (ref 0.1–0.9)
Monocytes: 8 %
Neutrophils Absolute: 3.8 10*3/uL (ref 1.4–7.0)
Neutrophils: 59 %
Platelets: 218 10*3/uL (ref 150–450)
RBC: 3.69 x10E6/uL — ABNORMAL LOW (ref 4.14–5.80)
RDW: 13.8 % (ref 11.6–15.4)
WBC: 6.3 10*3/uL (ref 3.4–10.8)

## 2023-12-19 LAB — CMP14+EGFR
ALT: 17 IU/L (ref 0–44)
AST: 24 IU/L (ref 0–40)
Albumin: 4.6 g/dL (ref 3.8–4.8)
Alkaline Phosphatase: 80 IU/L (ref 44–121)
BUN/Creatinine Ratio: 18 (ref 10–24)
BUN: 18 mg/dL (ref 8–27)
Bilirubin Total: 0.4 mg/dL (ref 0.0–1.2)
CO2: 25 mmol/L (ref 20–29)
Calcium: 9.3 mg/dL (ref 8.6–10.2)
Chloride: 104 mmol/L (ref 96–106)
Creatinine, Ser: 1.02 mg/dL (ref 0.76–1.27)
Globulin, Total: 2 g/dL (ref 1.5–4.5)
Glucose: 85 mg/dL (ref 70–99)
Potassium: 4.7 mmol/L (ref 3.5–5.2)
Sodium: 143 mmol/L (ref 134–144)
Total Protein: 6.6 g/dL (ref 6.0–8.5)
eGFR: 76 mL/min/{1.73_m2} (ref >59)

## 2023-12-19 LAB — TSH: TSH: 1.97 u[IU]/mL (ref 0.450–4.500)

## 2023-12-19 LAB — LIPID PANEL
Chol/HDL Ratio: 3.7 ratio (ref 0.0–5.0)
Cholesterol, Total: 172 mg/dL (ref 100–199)
HDL: 47 mg/dL (ref >39)
LDL Chol Calc (NIH): 111 mg/dL — ABNORMAL HIGH (ref 0–99)
Triglycerides: 73 mg/dL (ref 0–149)
VLDL Cholesterol Cal: 14 mg/dL (ref 5–40)

## 2023-12-19 LAB — HEMOGLOBIN A1C
Est. average glucose Bld gHb Est-mCnc: 108 mg/dL
Hgb A1c MFr Bld: 5.4 % (ref 4.8–5.6)

## 2023-12-19 LAB — VITAMIN D 25 HYDROXY (VIT D DEFICIENCY, FRACTURES): Vit D, 25-Hydroxy: 38.8 ng/mL (ref 30.0–100.0)

## 2023-12-19 LAB — PSA: Prostate Specific Ag, Serum: 3 ng/mL (ref 0.0–4.0)

## 2024-01-10 ENCOUNTER — Ambulatory Visit (HOSPITAL_COMMUNITY)

## 2024-02-06 ENCOUNTER — Encounter: Payer: Self-pay | Admitting: Internal Medicine

## 2024-02-06 ENCOUNTER — Ambulatory Visit (INDEPENDENT_AMBULATORY_CARE_PROVIDER_SITE_OTHER): Admitting: Internal Medicine

## 2024-02-06 VITALS — BP 126/86 | HR 83 | Ht 68.0 in | Wt 146.4 lb

## 2024-02-06 DIAGNOSIS — J309 Allergic rhinitis, unspecified: Secondary | ICD-10-CM | POA: Diagnosis not present

## 2024-02-06 DIAGNOSIS — H6690 Otitis media, unspecified, unspecified ear: Secondary | ICD-10-CM | POA: Diagnosis not present

## 2024-02-06 MED ORDER — AZITHROMYCIN 250 MG PO TABS: ORAL_TABLET | ORAL | 0 refills | Status: AC

## 2024-02-06 MED ORDER — AZITHROMYCIN 250 MG PO TABS: ORAL_TABLET | ORAL | 0 refills | Status: DC

## 2024-02-06 NOTE — Patient Instructions (Signed)
 Please start taking Azithromycin as prescribed.  Please continue using Flonase for allergies.

## 2024-02-06 NOTE — Progress Notes (Signed)
 Acute Office Visit  Subjective:    Patient ID: Joshua Fuller, male    DOB: 05/24/1946, 78 y.o.   MRN: [id number]  Chief Complaint  Patient presents with   Hearing Problem    Pt reports sx of hearing loss on left ear, ongoing since 02/01/2024.     HPI Patient is in today for c/o sudden onset left sided hearing loss on 02/01/24.  He has been blowing his nose hardly for the last 1 week.  He has chronic sinusitis, but reports worsening of nasal congestion and postnasal drip for the last 1 week.  Denies any ear pain or discharge currently, but has difficulty hearing on the left side.  He has tried using Debrox eardrops with some improvement in hearing.  Denies any fever, chills, dyspnea or wheezing currently.  Past Medical History:  Diagnosis Date   Allergy    penicillin   Anemia    Phreesia 03/09/2020   Anxiety    Aortic atherosclerosis (HCC)    Arthritis    Some on right wrist   Blood transfusion without reported diagnosis    Dr. Zoila Hines ordered blood transfusions years ago   Cancer [facility])    Skin cancer   Chronic mental illness    OCD   COPD (chronic obstructive pulmonary disease) (HCC)    That's what I'm told   Depression    Phreesia 03/09/2020   Helicobacter pylori gastritis 08/29/2013   TOOK PYLERA    History of MRSA infection    of the skin   Hypertension    Phreesia 03/09/2020   IDA (iron deficiency anemia)    Ileitis 11/06/2013   Neurofibroma of abdominal wall     Past Surgical History:  Procedure Laterality Date   ANKLE SURGERY     APPENDECTOMY N/A    Phreesia 03/09/2020   BIOPSY  07/25/2023   Procedure: BIOPSY;  Surgeon: Umberto Ganong, Bearl Limes, MD;  [facility];  Service: Gastroenterology;;   Capsule endoscopy  02/19/2007   WUJ:WJXBJYN view of gastric mucosa/Normal small bowel mucosa   COLONOSCOPY  02/02/2007   SLF:Two 4-mm to 6-mm rectal polyps/sigmoid colon diverticulosis and internal hemorrhoids. hyperplastic polyps.     COLONOSCOPY N/A 03/22/2013   SLF: ANEMIA MOST LIKELY DUE TO ULCERS IN terminal ileum/Moderate diverticulosis  in the sigmoid colon/ONE RECTAL polyp removed(hyperplastic). ileum bx with focal active ileitis. next TCS 03/2023 with overtube   COLONOSCOPY WITH PROPOFOL  N/A 07/25/2023   Procedure: COLONOSCOPY WITH PROPOFOL ;  Surgeon: Urban Garden, MD;  [facility];  Service: Gastroenterology;  Laterality: N/A;  10:00AM;ASA 1   COLONOSCOPY, ESOPHAGOGASTRODUODENOSCOPY (EGD) AND ESOPHAGEAL DILATION N/A 03/22/2013   WGN:FAOZHYQMV DUE Stricture at the gastroesophageal junction/Large hiatal hernia/MILD Non-erosive gastritis & DUODENITIS (+h.pylori)   DENTAL SURGERY  02/2020   ESOPHAGOGASTRODUODENOSCOPY  02/02/2007   HQI:ONGEX hiatal hernia closely examined and no evidence of Cameron ulcers.  Otherwise, normal esophagus. 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   POLYPECTOMY  07/25/2023   Procedure: POLYPECTOMY;  Surgeon: Urban Garden, MD;  [facility];  Service: Gastroenterology;;   SUBMUCOSAL LIFTING INJECTION  07/25/2023   Procedure: SUBMUCOSAL LIFTING INJECTION;  Surgeon: Urban Garden, MD;  [facility];  Service: Gastroenterology;;   TONSILLECTOMY      Family History  Problem Relation Age of Onset   Lung disease Mother    Obesity Father  Colon cancer Neg Hx     Social History   Socioeconomic History   Marital status: Married    Spouse name: Not on file   Number of children: 4   Years of education: Not on file   Highest education level: 12th grade  Occupational History   Occupation: Print production planner: Warden/ranger  Tobacco Use   Smoking status: Every Day    Current packs/day: 1.00    Average packs/day: 1 pack/day for 20.0 years (20.0 ttl pk-yrs)    Types: Cigarettes   Smokeless tobacco: Never   Tobacco comments:    10 cigarettes a day  as of 02/06/24  Vaping Use   Vaping status: Never Used  Substance and Sexual Activity   Alcohol use: Yes    Alcohol/week: 1.0 standard drink of alcohol    Types: 1 Glasses of wine per week    Comment: Couple glasses of wine per month    Drug use: Yes    Types: Marijuana    Comment: Daily as needed   Sexual activity: Not Currently    Birth control/protection: None  Other Topics Concern   Not on file  Social History Narrative   Retired from [employer]    Social Drivers of Corporate investment banker Strain: Low Risk  (02/05/2024)   Overall Financial Resource Strain (CARDIA)    Difficulty of Paying Living Expenses: Not hard at all  Food Insecurity: No Food Insecurity (02/05/2024)   Hunger Vital Sign    Worried About Running Out of Food in the Last Year: Never true    Ran Out of Food in the Last Year: Never true  Transportation Needs: No Transportation Needs (02/05/2024)   PRAPARE - Administrator, Civil Service (Medical): No    Lack of Transportation (Non-Medical): No  Physical Activity: Insufficiently Active (02/05/2024)   Exercise Vital Sign    Days of Exercise per Week: 2 days    Minutes of Exercise per Session: 10 min  Stress: No Stress Concern Present (02/05/2024)   [employer] of Occupational Health - Occupational Stress Questionnaire    Feeling of Stress : Only a little  Social Connections: Socially Integrated (02/05/2024)   Social Connection and Isolation Panel [NHANES]    Frequency of Communication with Friends and Family: More than three times a week    Frequency of Social Gatherings with Friends and Family: More than three times a week    Attends Religious Services: More than 4 times per year    Active Member of [employer] or Organizations: Yes    Attends Engineer, structural: More than 4 times per year    Marital Status: Married  Catering manager Violence: Not At Risk (04/20/2023)   Humiliation, Afraid, Rape, and Kick questionnaire    Fear of Current  or Ex-Partner: No    Emotionally Abused: No    Physically Abused: No    Sexually Abused: No    Outpatient Medications Prior to Visit  Medication Sig Dispense Refill   acetaminophen (TYLENOL) 500 MG tablet Take 500 mg by mouth daily at 6 (six) AM.     ferrous sulfate 325 (65 FE) MG EC tablet Take 325 mg by mouth daily with breakfast.     lamoTRIgine (LAMICTAL) 100 MG tablet Take 100 mg by mouth daily.      Multiple Vitamin (MULTIVITAMIN) capsule Take 1 capsule by mouth daily.     telmisartan  (MICARDIS ) 20 MG tablet TAKE 1 TABLET DAILY 90  tablet 3   Vilazodone HCl (VIIBRYD) 40 MG TABS Take 40 mg by mouth daily.     No facility-administered medications prior to visit.    Allergies  Allergen Reactions   Penicillins Rash    Has patient had a PCN reaction causing immediate rash, facial/tongue/throat swelling, SOB or lightheadedness with hypotension: yes Has patient had a PCN reaction causing severe rash involving mucus membranes or skin necrosis: no Has patient had a PCN reaction that required hospitalization: no Has patient had a PCN reaction occurring within the last 10 years: no If all of the above answers are "NO", then may proceed with Cephalosporin use.     Review of Systems  HENT:  Positive for congestion, hearing loss (Left-sided), postnasal drip, sinus pressure and sore throat.   Respiratory:  Negative for cough and shortness of breath.   Cardiovascular:  Negative for chest pain and palpitations.  Genitourinary:  Negative for dysuria and hematuria.  Musculoskeletal:  Negative for neck stiffness.  Skin:  Negative for rash.  Neurological:  Negative for dizziness and weakness.  Psychiatric/Behavioral:  Negative for agitation and behavioral problems.        Objective:     Physical Exam Constitutional:      General: He is not in acute distress.    Appearance: He is not diaphoretic.  HENT:     Left Ear: Decreased hearing noted. A middle ear effusion is present.      Nose: Congestion present.     Mouth/Throat:     Pharynx: Posterior oropharyngeal erythema present.  Eyes:     General: No scleral icterus.    Extraocular Movements: Extraocular movements intact.  Cardiovascular:     Rate and Rhythm: Normal rate and regular rhythm.     Heart sounds: Normal heart sounds. No murmur heard. Pulmonary:     Breath sounds: Normal breath sounds. No wheezing or rales.  Musculoskeletal:     Right lower leg: No edema.     Left lower leg: No edema.  Skin:    General: Skin is warm.     Findings: No rash.  Neurological:     General: No focal deficit present.     Mental Status: He is alert and oriented to person, place, and time.  Psychiatric:        Mood and Affect: Mood normal.        Behavior: Behavior normal.     BP 126/86 (BP Location: Left Arm)   Pulse 83   Ht 5\' 8"  (1.727 m)   Wt 146 lb 6.4 oz (66.4 kg)   SpO2 96%   BMI 22.26 kg/m  Wt Readings from Last 3 Encounters:  02/06/24 146 lb 6.4 oz (66.4 kg)  12/18/23 143 lb 12.8 oz (65.2 kg)  07/25/23 144 lb (65.3 kg)        Assessment & Plan:   Problem List Items Addressed This Visit   None Visit Diagnoses       Acute otitis media, unspecified otitis media type    -  Primary Started empiric azithromycin for otitis media and acute sinusitis Advised to avoid excessive pressure while blowing nose Advised to use Debrox only as needed for excess earwax    Relevant Medications   azithromycin (ZITHROMAX) 250 MG tablet     Allergic sinusitis     Likely has acute sinusitis in addition to allergic sinusitis Started empiric azithromycin Flonase for nasal congestion/allergies Advised to use sinus inhaler and/or sinus rinse as needed for nasal congestion Needs  to avoid smoking   Relevant Medications   azithromycin (ZITHROMAX) 250 MG tablet        Meds ordered this encounter  Medications   DISCONTD: azithromycin (ZITHROMAX) 250 MG tablet    Sig: Take 2 tablets on day 1, then 1 tablet daily on  days 2 through 5    Dispense:  6 tablet    Refill:  0   azithromycin (ZITHROMAX) 250 MG tablet    Sig: Take 2 tablets on day 1, then 1 tablet daily on days 2 through 5    Dispense:  6 tablet    Refill:  0     Felipe Cabell Alyssa Backbone, MD

## 2024-02-07 ENCOUNTER — Ambulatory Visit (HOSPITAL_COMMUNITY)
Admission: RE | Admit: 2024-02-07 | Discharge: 2024-02-07 | Disposition: A | Discharge status: Home / Self Care | Source: Ambulatory Visit | Attending: Internal Medicine | Admitting: Internal Medicine

## 2024-02-07 ENCOUNTER — Encounter (HOSPITAL_COMMUNITY): Payer: Self-pay | Admitting: Radiology

## 2024-02-07 DIAGNOSIS — I7121 Aneurysm of the ascending aorta, without rupture: Secondary | ICD-10-CM | POA: Diagnosis not present

## 2024-02-07 DIAGNOSIS — L821 Other seborrheic keratosis: Secondary | ICD-10-CM | POA: Diagnosis not present

## 2024-02-07 MED ORDER — IOHEXOL 350 MG/ML SOLN
75.0000 mL | Freq: Once | INTRAVENOUS | Status: AC | PRN
  Administered 2024-02-07: 75 mL via INTRAVENOUS

## 2024-02-08 ENCOUNTER — Ambulatory Visit: Payer: Self-pay | Admitting: Internal Medicine

## 2024-04-17 DIAGNOSIS — D225 Melanocytic nevi of trunk: Secondary | ICD-10-CM | POA: Diagnosis not present

## 2024-04-17 DIAGNOSIS — Z1283 Encounter for screening for malignant neoplasm of skin: Secondary | ICD-10-CM | POA: Diagnosis not present

## 2024-04-17 DIAGNOSIS — L820 Inflamed seborrheic keratosis: Secondary | ICD-10-CM | POA: Diagnosis not present

## 2024-04-17 DIAGNOSIS — L988 Other specified disorders of the skin and subcutaneous tissue: Secondary | ICD-10-CM | POA: Diagnosis not present

## 2024-04-17 DIAGNOSIS — D485 Neoplasm of uncertain behavior of skin: Secondary | ICD-10-CM | POA: Diagnosis not present

## 2024-04-22 ENCOUNTER — Ambulatory Visit: Payer: Medicare Other

## 2024-04-22 ENCOUNTER — Other Ambulatory Visit: Payer: Self-pay | Admitting: Internal Medicine

## 2024-04-22 VITALS — Ht 68.0 in | Wt 140.0 lb

## 2024-04-22 DIAGNOSIS — Z0000 Encounter for general adult medical examination without abnormal findings: Secondary | ICD-10-CM | POA: Diagnosis not present

## 2024-04-22 DIAGNOSIS — I10 Essential (primary) hypertension: Secondary | ICD-10-CM

## 2024-04-22 NOTE — Patient Instructions (Addendum)
 Joshua Fuller , Thank you for taking time out of your busy schedule to complete your Annual Wellness Visit with me. I enjoyed our conversation and look forward to speaking with you again next year. I, as well as your care team,  appreciate your ongoing commitment to your health goals. Please review the following plan we discussed and let me know if I can assist you in the future. Your Game plan/ To Do List    Ophthalmology Resources:  There are several Eye Doctors in your area. Here are a few that usually accept all insurance types:  [facility] [address] [place], [place] [address] Phone: [phone]  [facility] [address] [place], [place] [address] Phone: [phone]  [facility] [address] [place], [place] [address] Phone: [phone]  [facility] [address] [place], [place] [address] Phone: [phone]  [facility] [address] [place], [place] [address] Phone: [phone]  Please let us  know if you require a referral for an eye exam appointment. Thank you!   Follow up Visits: We will see or speak with you next year for your Next Medicare AWV with our clinical staff  Appointment with Dr. Tobie for memory loss on April 26, 2024 at 9:20. Please arrive 15 mins early.   Clinician Recommendations:  Aim for 30 minutes of exercise or brisk walking, 6-8 glasses of water , and 5 servings of fruits and vegetables each day.    Wishing you many blessings and good health during the next year until our next visit.  -Yurianna Tusing   This is a list of the screenings recommended for you:  Health Maintenance  Topic Date Due   DTaP/Tdap/Td vaccine (1 - Tdap) Never done   Zoster (Shingles) Vaccine (1 of 2) Never done   COVID-19 Vaccine (3 - Moderna risk series) 10/30/2020   Flu Shot  04/12/2024   Screening for Lung Cancer  02/06/2025   Medicare Annual Wellness Visit  04/22/2025   Colon Cancer Screening  07/24/2026    Pneumococcal Vaccine for age over 81  Completed   Hepatitis C Screening  Completed   Hepatitis B Vaccine  Aged Out   HPV Vaccine  Aged Out   Meningitis B Vaccine  Aged Out    Advanced directives: (Copy Requested) Please bring a copy of your health care power of attorney and living will to the office to be added to your chart at your convenience. You can mail to [facility] [address] [place], [place] [address] or email to [email or web address] Advance Care Planning is important because it:  [x]  Makes sure you receive the medical care that is consistent with your values, goals, and preferences  [x]  It provides guidance to your family and loved ones and reduces their decisional burden about whether or not they are making the right decisions based on your wishes.  Follow the link provided in your after visit summary or read over the paperwork we have mailed to you to help you started getting your Advance Directives in place. If you need assistance in completing these, please reach out to us  so that we can help you!  See attachments for Preventive Care and Fall Prevention Tips.

## 2024-04-22 NOTE — Progress Notes (Signed)
 Patient complains for memory difficulty. 6CIT score was 0. He stated that he was most concerned about the declining mental issue but he states this is memory and not depression. His depression is controlled and at a good point.  Subjective:   Joshua Fuller is a 78 y.o. who presents for a Medicare Wellness preventive visit.  As a reminder, Annual Wellness Visits don't include a physical exam, and some assessments may be limited, especially if this visit is performed virtually. We may recommend an in-person follow-up visit with your provider if needed.  Visit Complete: Virtual I connected with  [name] on 04/22/24 by a audio enabled telemedicine application and verified that I am speaking with the correct person using two identifiers.  Patient Location: Home  Provider Location: Home Office  I discussed the limitations of evaluation and management by telemedicine. The patient expressed understanding and agreed to proceed.  Vital Signs: Because this visit was a virtual/telehealth visit, some criteria may be missing or patient reported. Any vitals not documented were not able to be obtained and vitals that have been documented are patient reported.  VideoDeclined- This patient declined Librarian, academic. Therefore the visit was completed with audio only.  Persons Participating in Visit: Patient.  AWV Questionnaire: No: Patient Medicare AWV questionnaire was not completed prior to this visit.  Cardiac Risk Factors include: advanced age (>19men, >95 women);hypertension;male gender;sedentary lifestyle;smoking/ tobacco exposure     Objective:    Today's Vitals   04/22/24 0849  Weight: 140 lb (63.5 kg)  Height: 5' 8 (1.727 m)   Body mass index is 21.29 kg/m.     04/22/2024    8:50 AM 07/25/2023    8:56 AM 04/20/2023   11:26 AM 04/13/2022    2:02 PM 04/12/2021   10:39 AM 02/09/2018   11:49 AM 03/22/2013   10:01 AM  Advanced Directives  Does  Patient Have a Medical Advance Directive? Yes No No No Yes No  Patient does not have advance directive;Patient would not like information   Type of Public librarian Power of [place];Living will    Living will    Does patient want to make changes to medical advance directive?     No - Patient declined    Copy of Healthcare Power of Attorney in Chart? No - copy requested        Would patient like information on creating a medical advance directive?  No - Guardian declined No - Patient declined No - Patient declined No - Patient declined No - Patient declined    Pre-existing out of facility DNR order (yellow form or pink MOST form)       No      Data saved with a previous flowsheet row definition    Current Medications (verified) Outpatient Encounter Medications as of 04/22/2024  Medication Sig   acetaminophen (TYLENOL) 500 MG tablet Take 500 mg by mouth daily at 6 (six) AM.   ferrous sulfate 325 (65 FE) MG EC tablet Take 325 mg by mouth daily with breakfast.   lamoTRIgine (LAMICTAL) 100 MG tablet Take 100 mg by mouth daily.    Multiple Vitamin (MULTIVITAMIN) capsule Take 1 capsule by mouth daily.   telmisartan  (MICARDIS ) 20 MG tablet TAKE 1 TABLET DAILY   Vilazodone HCl (VIIBRYD) 40 MG TABS Take 40 mg by mouth daily.   No facility-administered encounter medications on file as of 04/22/2024.    Allergies (verified) Penicillins   History: Past Medical History:  Diagnosis Date   Allergy    penicillin   Anemia    Phreesia 03/09/2020   Anxiety    Aortic atherosclerosis (HCC)    Arthritis    Some on right wrist   Blood transfusion without reported diagnosis    Dr. Vonzell ordered blood transfusions years ago   Cancer [facility])    Skin cancer   Chronic mental illness    OCD   COPD (chronic obstructive pulmonary disease) (HCC)    That's what I'm told   Depression    Phreesia 03/09/2020   Helicobacter pylori gastritis 08/29/2013   TOOK PYLERA    History of MRSA infection     of the skin   Hypertension    Phreesia 03/09/2020   IDA (iron deficiency anemia)    Ileitis 11/06/2013   Neurofibroma of abdominal wall    Past Surgical History:  Procedure Laterality Date   ANKLE SURGERY     APPENDECTOMY N/A    Phreesia 03/09/2020   BIOPSY  07/25/2023   Procedure: BIOPSY;  Surgeon: Eartha Angelia Sieving, MD;  [facility];  Service: Gastroenterology;;   Capsule endoscopy  02/19/2007   DOQ:Opfpuzi view of gastric mucosa/Normal small bowel mucosa   COLONOSCOPY  02/02/2007   SLF:Two 4-mm to 6-mm rectal polyps/sigmoid colon diverticulosis and internal hemorrhoids. hyperplastic polyps.    COLONOSCOPY N/A 03/22/2013   SLF: ANEMIA MOST LIKELY DUE TO ULCERS IN terminal ileum/Moderate diverticulosis  in the sigmoid colon/ONE RECTAL polyp removed(hyperplastic). ileum bx with focal active ileitis. next TCS 03/2023 with overtube   COLONOSCOPY WITH PROPOFOL  N/A 07/25/2023   Procedure: COLONOSCOPY WITH PROPOFOL ;  Surgeon: Eartha Angelia Sieving, MD;  [facility];  Service: Gastroenterology;  Laterality: N/A;  10:00AM;ASA 1   COLONOSCOPY, ESOPHAGOGASTRODUODENOSCOPY (EGD) AND ESOPHAGEAL DILATION N/A 03/22/2013   DOQ:[id number] DUE Stricture at the gastroesophageal junction/Large hiatal hernia/MILD Non-erosive gastritis & DUODENITIS (+h.pylori)   DENTAL SURGERY  02/2020   ESOPHAGOGASTRODUODENOSCOPY  02/02/2007   DOQ:Ojmhz hiatal hernia closely examined and no evidence of Cameron ulcers.  Otherwise, normal esophagus. 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   POLYPECTOMY  07/25/2023   Procedure: POLYPECTOMY;  Surgeon: Eartha Angelia, Sieving, MD;  [facility];  Service: Gastroenterology;;   SUBMUCOSAL LIFTING INJECTION  07/25/2023   Procedure: SUBMUCOSAL LIFTING INJECTION;  Surgeon: Eartha Angelia Sieving, MD;  [facility];  Service: Gastroenterology;;    TONSILLECTOMY     Family History  Problem Relation Age of Onset   Lung disease Mother    Obesity Father    Colon cancer Neg Hx    Social History   Socioeconomic History   Marital status: Married    Spouse name: Not on file   Number of children: 4   Years of education: Not on file   Highest education level: 12th grade  Occupational History   Occupation: Print production planner: Warden/ranger  Tobacco Use   Smoking status: Every Day    Current packs/day: 1.00    Average packs/day: 1 pack/day for 20.0 years (20.0 ttl pk-yrs)    Types: Cigarettes   Smokeless tobacco: Never   Tobacco comments:    10 cigarettes a day as of 02/06/24  Vaping Use   Vaping status: Never Used  Substance and Sexual Activity   Alcohol use: Yes    Alcohol/week: 1.0 standard drink of alcohol  Types: 1 Glasses of wine per week    Comment: Couple glasses of wine per month    Drug use: Yes    Types: Marijuana    Comment: Daily as needed   Sexual activity: Not Currently    Birth control/protection: None  Other Topics Concern   Not on file  Social History Narrative   Retired from [employer]    Social Drivers of Corporate investment banker Strain: Low Risk  (04/22/2024)   Overall Financial Resource Strain (CARDIA)    Difficulty of Paying Living Expenses: Not hard at all  Food Insecurity: No Food Insecurity (04/22/2024)   Hunger Vital Sign    Worried About Running Out of Food in the Last Year: Never true    Ran Out of Food in the Last Year: Never true  Transportation Needs: No Transportation Needs (04/22/2024)   PRAPARE - Administrator, Civil Service (Medical): No    Lack of Transportation (Non-Medical): No  Physical Activity: Inactive (04/22/2024)   Exercise Vital Sign    Days of Exercise per Week: 0 days    Minutes of Exercise per Session: 0 min  Stress: No Stress Concern Present (04/22/2024)   [employer] of Occupational Health - Occupational Stress Questionnaire    Feeling  of Stress: Not at all  Social Connections: Socially Integrated (04/22/2024)   Social Connection and Isolation Panel    Frequency of Communication with Friends and Family: More than three times a week    Frequency of Social Gatherings with Friends and Family: More than three times a week    Attends Religious Services: More than 4 times per year    Active Member of [employer] or Organizations: Yes    Attends Engineer, structural: More than 4 times per year    Marital Status: Married    Tobacco Counseling Ready to quit: Yes Counseling given: Yes Tobacco comments: 10 cigarettes a day as of 02/06/24    Clinical Intake:  Pre-visit preparation completed: Yes  Pain : No/denies pain     BMI - recorded: 21.29 Nutritional Status: BMI of 19-24  Normal Nutritional Risks: None Diabetes: No  Lab Results  Component Value Date   HGBA1C 5.4 12/18/2023   HGBA1C 5.4 09/14/2022   HGBA1C 5.3 12/08/2021     How often do you need to have someone help you when you read instructions, pamphlets, or other written materials from your doctor or pharmacy?: 1 - Never  Interpreter Needed?: No  Information entered by :: Stefano [facility]N [facility]   Activities of Daily Living     04/22/2024    8:50 AM  In your present state of health, do you have any difficulty performing the following activities:  Hearing? 1  Comment declines referral  Vision? 0  Difficulty concentrating or making decisions? 0  Walking or climbing stairs? 0  Dressing or bathing? 0  Doing errands, shopping? 0  Preparing Food and eating ? N  Using the Toilet? N  In the past six months, have you accidently leaked urine? N  Do you have problems with loss of bowel control? N  Managing your Medications? N  Managing your Finances? N  Housekeeping or managing your Housekeeping? N    Patient Care Team: Tobie Suzzane POUR, MD as PCP - General (Internal Medicine) True Ronal BRAVO, NP (Psychiatry) Eartha Flavors, Toribio, MD as  Consulting Physician (Gastroenterology) Marcella Fanny, MD as Referring Physician (Ophthalmology)  I have updated your Care Teams any recent Medical Services  you may have received from other providers in the past year.     Assessment:   This is a routine wellness examination for Joshua Fuller.  Hearing/Vision screen Hearing Screening - Comments:: C/O difficulty hearing. Declines referral at this time.  Vision Screening - Comments:: Patient is up to date on exams. Last went to see [place] [facility] in [place].    Goals Addressed               This Visit's Progress     Activity and Exercise Increased (pt-stated)        I want to increase my activity. I want to go see my mother in [place]. I also want to quit smoking.       Quit Smoking (pt-stated)        I want to quit smoking        Depression Screen     04/22/2024    9:17 AM 02/06/2024    8:15 AM 02/06/2024    8:10 AM 12/18/2023    9:55 AM 12/18/2023    8:43 AM 06/16/2023   10:15 AM 04/21/2023    8:23 AM  PHQ 2/9 Scores  PHQ - 2 Score 0 2 0 2 0 2 1  PHQ- 9 Score 0 5 0 5 0 5 6    Fall Risk     04/22/2024    9:02 AM 02/06/2024    8:10 AM 12/18/2023    8:43 AM 06/16/2023   10:15 AM 04/21/2023    8:23 AM  Fall Risk   Falls in the past year? 0 0 0 0 0  Number falls in past yr: 0 0 0 0 0  Injury with Fall? 0 0 0 0 0  Risk for fall due to : No Fall Risks No Fall Risks No Fall Risks    Follow up Falls evaluation completed;Education provided;Falls prevention discussed Falls evaluation completed Falls evaluation completed      MEDICARE RISK AT HOME:  Medicare Risk at Home Any stairs in or around the home?: Yes If so, are there any without handrails?: No Home free of loose throw rugs in walkways, pet beds, electrical cords, etc?: Yes Adequate lighting in your home to reduce risk of falls?: Yes Life alert?: No Use of a cane, walker or w/c?: No Grab bars in the bathroom?: No (discussed options such as suction grab bars that are  available at [employer].) Shower chair or bench in shower?: No Elevated toilet seat or a handicapped toilet?: No  TIMED UP AND GO:  Was the test performed?  No  Cognitive Function: 6CIT completed        04/22/2024    9:17 AM 04/20/2023   11:27 AM 04/13/2022    2:04 PM 04/12/2021   10:48 AM  6CIT Screen  What Year? 0 points 0 points 0 points 0 points  What month? 0 points 0 points 0 points 0 points  What time? 0 points 0 points 0 points 0 points  Count back from 20 0 points 0 points 0 points 0 points  Months in reverse 0 points 0 points 0 points 0 points  Repeat phrase 0 points 0 points 0 points 0 points  Total Score 0 points 0 points 0 points 0 points    Immunizations Immunization History  Administered Date(s) Administered   Fluad Quad(high Dose 65+) 07/10/2020, 06/10/2021, 08/12/2022   Fluad Trivalent(High Dose 65+) 06/16/2023   Moderna SARS-COV2 Booster Vaccination 10/02/2020   Moderna Sars-Covid-2 Vaccination 10/27/2019, 11/25/2019  PNEUMOCOCCAL CONJUGATE-20 12/08/2021   Pneumococcal Conjugate-13 12/19/2019    Screening Tests Health Maintenance  Topic Date Due   DTaP/Tdap/Td (1 - Tdap) Never done   Zoster Vaccines- Shingrix (1 of 2) Never done   COVID-19 Vaccine (3 - Moderna risk series) 10/30/2020   INFLUENZA VACCINE  04/12/2024   Lung Cancer Screening  02/06/2025   Medicare Annual Wellness (AWV)  04/22/2025   Colonoscopy  07/24/2026   Pneumococcal Vaccine: 50+ Years  Completed   Hepatitis C Screening  Completed   Hepatitis B Vaccines  Aged Out   HPV VACCINES  Aged Out   Meningococcal B Vaccine  Aged Out    Health Maintenance  Health Maintenance Due  Topic Date Due   DTaP/Tdap/Td (1 - Tdap) Never done   Zoster Vaccines- Shingrix (1 of 2) Never done   COVID-19 Vaccine (3 - Moderna risk series) 10/30/2020   INFLUENZA VACCINE  04/12/2024   Health Maintenance Items Addressed: Patient advised of recommended vaccines and where to obtain those vaccines with verbal  understanding  Additional Screening:  Vision Screening: Recommended annual ophthalmology exams for early detection of glaucoma and other disorders of the eye. Would you like a referral to an eye doctor? No    Dental Screening: Recommended annual dental exams for proper oral hygiene  Community Resource Referral / Chronic Care Management: CRR required this visit?  No   CCM required this visit?  No   Plan:    I have personally reviewed and noted the following in the patient's chart:   Medical and social history Use of alcohol, tobacco or illicit drugs  Current medications and supplements including opioid prescriptions. Patient is not currently taking opioid prescriptions. Functional ability and status Nutritional status Physical activity Advanced directives List of other physicians Hospitalizations, surgeries, and ER visits in previous 12 months Vitals Screenings to include cognitive, depression, and falls Referrals and appointments  In addition, I have reviewed and discussed with patient certain preventive protocols, quality metrics, and best practice recommendations. A written personalized care plan for preventive services as well as general preventive health recommendations were provided to patient.   Nadirah Socorro, CMA   04/22/2024   After Visit Summary: (MyChart) Due to this being a telephonic visit, the after visit summary with patients personalized plan was offered to patient via MyChart   Notes: Please refer to Routing Comments.

## 2024-04-26 ENCOUNTER — Encounter: Payer: Self-pay | Admitting: Internal Medicine

## 2024-04-26 ENCOUNTER — Ambulatory Visit (INDEPENDENT_AMBULATORY_CARE_PROVIDER_SITE_OTHER): Admitting: Internal Medicine

## 2024-04-26 VITALS — BP 146/82 | HR 65 | Ht 68.0 in | Wt 146.2 lb

## 2024-04-26 DIAGNOSIS — R4789 Other speech disturbances: Secondary | ICD-10-CM | POA: Diagnosis not present

## 2024-04-26 DIAGNOSIS — I10 Essential (primary) hypertension: Secondary | ICD-10-CM

## 2024-04-26 NOTE — Assessment & Plan Note (Signed)
 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

## 2024-04-26 NOTE — Patient Instructions (Addendum)
 Please continue to take medications as prescribed.  Please continue to follow low salt diet and perform moderate exercise/walking as tolerated.

## 2024-04-26 NOTE — Progress Notes (Signed)
 Established Patient Office Visit  Subjective:  Patient ID: Joshua Fuller, male    DOB: May 12, 1946  Age: 78 y.o. MRN: [id number]  CC:  Chief Complaint  Patient presents with   Memory Loss    Has concerns about memory loss.     HPI Joshua Fuller is a 78 y.o. male with past medical history of COPD, depression, OCD, SCC of skin, erectile dysfunction and iron deficiency anemia who presents for evaluation of memory.  He reports word finding difficulty at times.  He has difficulty recalling names at times, but later reports that he has difficulty remembering names of the people that he rarely makes.  He is MoCA score was 30 out of 30 today.  He was reassured after that evaluation.  His BP was elevated today as he has not taken telmisartan  this morning.  Denies any headache, dizziness, chest pain, or dyspnea.  Past Medical History:  Diagnosis Date   Allergy    penicillin   Anemia    Phreesia 03/09/2020   Anxiety    Aortic atherosclerosis (HCC)    Arthritis    Some on right wrist   Blood transfusion without reported diagnosis    Dr. Vonzell ordered blood transfusions years ago   Cancer [facility])    Skin cancer   Chronic mental illness    OCD   COPD (chronic obstructive pulmonary disease) (HCC)    That's what I'm told   Depression    Phreesia 03/09/2020   Helicobacter pylori gastritis 08/29/2013   TOOK PYLERA    History of MRSA infection    of the skin   Hypertension    Phreesia 03/09/2020   IDA (iron deficiency anemia)    Ileitis 11/06/2013   Neurofibroma of abdominal wall     Past Surgical History:  Procedure Laterality Date   ANKLE SURGERY     APPENDECTOMY N/A    Phreesia 03/09/2020   BIOPSY  07/25/2023   Procedure: BIOPSY;  Surgeon: Eartha Flavors, Toribio, MD;  [facility];  Service: Gastroenterology;;   Capsule endoscopy  02/19/2007   DOQ:Opfpuzi view of gastric mucosa/Normal small bowel mucosa   COLONOSCOPY  02/02/2007   SLF:Two 4-mm to  6-mm rectal polyps/sigmoid colon diverticulosis and internal hemorrhoids. hyperplastic polyps.    COLONOSCOPY N/A 03/22/2013   SLF: ANEMIA MOST LIKELY DUE TO ULCERS IN terminal ileum/Moderate diverticulosis  in the sigmoid colon/ONE RECTAL polyp removed(hyperplastic). ileum bx with focal active ileitis. next TCS 03/2023 with overtube   COLONOSCOPY WITH PROPOFOL  N/A 07/25/2023   Procedure: COLONOSCOPY WITH PROPOFOL ;  Surgeon: Eartha Flavors Toribio, MD;  [facility]: [facility];  Service: Gastroenterology;  Laterality: N/A;  10:00AM;ASA 1   COLONOSCOPY, ESOPHAGOGASTRODUODENOSCOPY (EGD) AND ESOPHAGEAL DILATION N/A 03/22/2013   DOQ:[id number] DUE Stricture at the gastroesophageal junction/Large hiatal hernia/MILD Non-erosive gastritis & DUODENITIS (+h.pylori)   DENTAL SURGERY  02/2020   ESOPHAGOGASTRODUODENOSCOPY  02/02/2007   DOQ:Ojmhz hiatal hernia closely examined and no evidence of Cameron ulcers.  Otherwise, normal esophagus. 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   POLYPECTOMY  07/25/2023   Procedure: POLYPECTOMY;  Surgeon: Eartha Flavors Toribio, MD;  [facility]: [facility];  Service: Gastroenterology;;   SUBMUCOSAL LIFTING INJECTION  07/25/2023   Procedure: SUBMUCOSAL LIFTING INJECTION;  Surgeon: Eartha Flavors Toribio, MD;  Location: [facility];  Service: Gastroenterology;;   TONSILLECTOMY      Family History  Problem  Relation Age of Onset   Lung disease Mother    Obesity Father    Colon cancer Neg Hx     Social History   Socioeconomic History   Marital status: Married    Spouse name: Not on file   Number of children: 4   Years of education: Not on file   Highest education level: 12th grade  Occupational History   Occupation: Print production planner: Warden/ranger  Tobacco Use   Smoking status: Every Day    Current packs/day: 1.00    Average packs/day: 1 pack/day for 20.0 years (20.0 ttl  pk-yrs)    Types: Cigarettes   Smokeless tobacco: Never   Tobacco comments:    10 cigarettes a day as of 02/06/24  Vaping Use   Vaping status: Never Used  Substance and Sexual Activity   Alcohol use: Yes    Alcohol/week: 1.0 standard drink of alcohol    Types: 1 Glasses of wine per week    Comment: Couple glasses of wine per month    Drug use: Yes    Types: Marijuana    Comment: Daily as needed   Sexual activity: Not Currently    Birth control/protection: None  Other Topics Concern   Not on file  Social History Narrative   Retired from [employer]    Social Drivers of Corporate investment banker Strain: Low Risk  (04/22/2024)   Overall Financial Resource Strain (CARDIA)    Difficulty of Paying Living Expenses: Not hard at all  Food Insecurity: No Food Insecurity (04/22/2024)   Hunger Vital Sign    Worried About Running Out of Food in the Last Year: Never true    Ran Out of Food in the Last Year: Never true  Transportation Needs: No Transportation Needs (04/22/2024)   PRAPARE - Administrator, Civil Service (Medical): No    Lack of Transportation (Non-Medical): No  Physical Activity: Inactive (04/22/2024)   Exercise Vital Sign    Days of Exercise per Week: 0 days    Minutes of Exercise per Session: 0 min  Stress: No Stress Concern Present (04/22/2024)   [employer] of Occupational Health - Occupational Stress Questionnaire    Feeling of Stress: Not at all  Social Connections: Socially Integrated (04/22/2024)   Social Connection and Isolation Panel    Frequency of Communication with Friends and Family: More than three times a week    Frequency of Social Gatherings with Friends and Family: More than three times a week    Attends Religious Services: More than 4 times per year    Active Member of [employer] or Organizations: Yes    Attends Banker Meetings: More than 4 times per year    Marital Status: Married  Catering manager Violence: Not At Risk  (04/22/2024)   Humiliation, Afraid, Rape, and Kick questionnaire    Fear of Current or Ex-Partner: No    Emotionally Abused: No    Physically Abused: No    Sexually Abused: No    Outpatient Medications Prior to Visit  Medication Sig Dispense Refill   acetaminophen (TYLENOL) 500 MG tablet Take 500 mg by mouth daily at 6 (six) AM.     ferrous sulfate 325 (65 FE) MG EC tablet Take 325 mg by mouth daily with breakfast.     lamoTRIgine (LAMICTAL) 100 MG tablet Take 100 mg by mouth daily.      Multiple Vitamin (MULTIVITAMIN) capsule Take 1 capsule by mouth daily.  telmisartan  (MICARDIS ) 20 MG tablet TAKE 1 TABLET DAILY 90 tablet 3   Vilazodone HCl (VIIBRYD) 40 MG TABS Take 40 mg by mouth daily.     No facility-administered medications prior to visit.    Allergies  Allergen Reactions   Penicillins Rash    Has patient had a PCN reaction causing immediate rash, facial/tongue/throat swelling, SOB or lightheadedness with hypotension: yes Has patient had a PCN reaction causing severe rash involving mucus membranes or skin necrosis: no Has patient had a PCN reaction that required hospitalization: no Has patient had a PCN reaction occurring within the last 10 years: no If all of the above answers are NO, then may proceed with Cephalosporin use.     ROS Review of Systems  Constitutional:  Negative for chills and fever.  HENT:  Negative for congestion and sore throat.   Eyes:  Negative for pain and discharge.  Respiratory:  Negative for cough and shortness of breath.   Cardiovascular:  Positive for palpitations (Intermittent). Negative for chest pain.  Gastrointestinal:  Negative for diarrhea, nausea and vomiting.  Endocrine: Negative for polydipsia and polyuria.  Genitourinary:  Negative for dysuria and hematuria.  Musculoskeletal:  Positive for arthralgias and back pain. Negative for neck pain and neck stiffness.  Skin:  Negative for rash.  Neurological:  Negative for dizziness,  weakness, numbness and headaches.  Psychiatric/Behavioral:  Positive for sleep disturbance. Negative for agitation, behavioral problems, hallucinations and suicidal ideas. The patient is nervous/anxious.       Objective:    Physical Exam Vitals reviewed.  Constitutional:      General: He is not in acute distress.    Appearance: He is not diaphoretic.  HENT:     Head: Normocephalic and atraumatic.     Nose: Nose normal.     Mouth/Throat:     Mouth: Mucous membranes are moist.  Eyes:     General: No scleral icterus.    Extraocular Movements: Extraocular movements intact.  Cardiovascular:     Rate and Rhythm: Normal rate and regular rhythm.     Pulses: Normal pulses.     Heart sounds: Normal heart sounds. No murmur heard. Pulmonary:     Breath sounds: Normal breath sounds. No wheezing or rales.  Musculoskeletal:        General: Swelling (MCP, PIP and DIP joints of left hand - Mild) present.     Cervical back: Neck supple. No tenderness.     Right lower leg: No edema.     Left lower leg: No edema.  Skin:    General: Skin is warm.     Findings: No rash.     Comments: Erythematous papule over scalp  Neurological:     General: No focal deficit present.     Mental Status: He is alert and oriented to person, place, and time.     Sensory: No sensory deficit.     Motor: No weakness.  Psychiatric:        Mood and Affect: Mood normal.        Behavior: Behavior normal.     BP (!) 146/82 (BP Location: Left Arm)   Pulse 65   Ht 5' 8 (1.727 m)   Wt 146 lb 3.2 oz (66.3 kg)   SpO2 96%   BMI 22.23 kg/m  Wt Readings from Last 3 Encounters:  04/26/24 146 lb 3.2 oz (66.3 kg)  04/22/24 140 lb (63.5 kg)  02/06/24 146 lb 6.4 oz (66.4 kg)    Lab Results  Component Value Date   TSH 1.970 12/18/2023   Lab Results  Component Value Date   WBC 6.3 12/18/2023   HGB 12.7 (L) 12/18/2023   HCT 36.5 (L) 12/18/2023   MCV 99 (H) 12/18/2023   PLT 218 12/18/2023   Lab Results   Component Value Date   NA 143 12/18/2023   K 4.7 12/18/2023   CO2 25 12/18/2023   GLUCOSE 85 12/18/2023   BUN 18 12/18/2023   CREATININE 1.02 12/18/2023   BILITOT 0.4 12/18/2023   ALKPHOS 80 12/18/2023   AST 24 12/18/2023   ALT 17 12/18/2023   PROT 6.6 12/18/2023   ALBUMIN 4.6 12/18/2023   CALCIUM 9.3 12/18/2023   EGFR 76 12/18/2023   Lab Results  Component Value Date   CHOL 172 12/18/2023   Lab Results  Component Value Date   HDL 47 12/18/2023   Lab Results  Component Value Date   LDLCALC 111 (H) 12/18/2023   Lab Results  Component Value Date   TRIG 73 12/18/2023   Lab Results  Component Value Date   CHOLHDL 3.7 12/18/2023   Lab Results  Component Value Date   HGBA1C 5.4 12/18/2023      Assessment & Plan:   Problem List Items Addressed This Visit       Cardiovascular and Mediastinum   Essential hypertension   BP Readings from Last 1 Encounters:  04/26/24 (!) 146/82   Elevated today as he has not taken telmisartan  yet Usually well-controlled with telmisartan  20 mg QD Needs to quit smoking Counseled for compliance with the medications Advised DASH diet and moderate exercise/walking, at least 150 mins/week        Other   Word finding difficulty - Primary   He was worried about cognitive decline/dementia MoCA score is 30 out of 30 He is physically and mentally active currently, independent of ADLs and IADLs, 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   Follow-up: Return if symptoms worsen or fail to improve.    Suzzane MARLA Blanch, MD

## 2024-04-26 NOTE — Assessment & Plan Note (Signed)
 BP Readings from Last 1 Encounters:  04/26/24 (!) 146/82   Elevated today as he has not taken telmisartan  yet Usually well-controlled with telmisartan  20 mg QD Needs to quit smoking Counseled for compliance with the medications Advised DASH diet and moderate exercise/walking, at least 150 mins/week

## 2024-05-14 DIAGNOSIS — F39 Unspecified mood [affective] disorder: Secondary | ICD-10-CM | POA: Diagnosis not present

## 2024-05-14 DIAGNOSIS — N529 Male erectile dysfunction, unspecified: Secondary | ICD-10-CM | POA: Diagnosis not present

## 2024-05-14 DIAGNOSIS — F429 Obsessive-compulsive disorder, unspecified: Secondary | ICD-10-CM | POA: Diagnosis not present

## 2024-05-20 DIAGNOSIS — D2272 Melanocytic nevi of left lower limb, including hip: Secondary | ICD-10-CM | POA: Diagnosis not present

## 2024-05-20 DIAGNOSIS — L988 Other specified disorders of the skin and subcutaneous tissue: Secondary | ICD-10-CM | POA: Diagnosis not present

## 2024-05-20 DIAGNOSIS — D485 Neoplasm of uncertain behavior of skin: Secondary | ICD-10-CM | POA: Diagnosis not present

## 2024-06-15 DIAGNOSIS — R051 Acute cough: Secondary | ICD-10-CM | POA: Diagnosis not present

## 2024-06-15 DIAGNOSIS — J00 Acute nasopharyngitis [common cold]: Secondary | ICD-10-CM | POA: Diagnosis not present

## 2024-06-15 DIAGNOSIS — J329 Chronic sinusitis, unspecified: Secondary | ICD-10-CM | POA: Diagnosis not present

## 2024-06-18 ENCOUNTER — Encounter: Payer: Self-pay | Admitting: Internal Medicine

## 2024-06-18 ENCOUNTER — Ambulatory Visit (INDEPENDENT_AMBULATORY_CARE_PROVIDER_SITE_OTHER): Admitting: Internal Medicine

## 2024-06-18 VITALS — BP 127/86 | HR 72 | Ht 68.0 in | Wt 144.6 lb

## 2024-06-18 DIAGNOSIS — J0110 Acute frontal sinusitis, unspecified: Secondary | ICD-10-CM | POA: Insufficient documentation

## 2024-06-18 DIAGNOSIS — I10 Essential (primary) hypertension: Secondary | ICD-10-CM | POA: Diagnosis not present

## 2024-06-18 DIAGNOSIS — D509 Iron deficiency anemia, unspecified: Secondary | ICD-10-CM | POA: Diagnosis not present

## 2024-06-18 DIAGNOSIS — I7121 Aneurysm of the ascending aorta, without rupture: Secondary | ICD-10-CM

## 2024-06-18 DIAGNOSIS — R7303 Prediabetes: Secondary | ICD-10-CM

## 2024-06-18 DIAGNOSIS — J439 Emphysema, unspecified: Secondary | ICD-10-CM | POA: Diagnosis not present

## 2024-06-18 MED ORDER — AZITHROMYCIN 250 MG PO TABS: ORAL_TABLET | ORAL | 0 refills | Status: AC

## 2024-06-18 MED ORDER — METHYLPREDNISOLONE 4 MG PO TBPK: ORAL_TABLET | ORAL | 0 refills | Status: AC

## 2024-06-18 NOTE — Assessment & Plan Note (Signed)
 His current symptoms are likely due to acute bacterial sinusitis in addition to allergic sinusitis Started empiric azithromycin  as he has persistent symptoms despite symptomatic treatment Continue Claritin for allergies Advised to use vaporizer and/or sinus inhaler as needed for nasal congestion Mucinex as needed for cough

## 2024-06-18 NOTE — Progress Notes (Signed)
 Established Patient Office Visit  Subjective:  Patient ID: Joshua Fuller, male    DOB: [birth year]  Age: 78 y.o. MRN: [id number]  CC:  Chief Complaint  Patient presents with   Medical Management of Chronic Issues    Follow up     Sinus Problem    Reports sx of sinus due to weather change, ongoing for about 8 days.     HPI Joshua Fuller is a 78 y.o. male with past medical history of COPD, depression, OCD, SCC of skin, erectile dysfunction and iron deficiency anemia who presents for follow up of his chronic medical conditions.  HTN: His BP is wnl today. He denies any headache, dizziness, chest pain or dyspnea.  He reports intermittent palpitations even in resting state, likely related to anxiety.  He has a history of OCD and MDD and takes Viibryd and Lamictal.  He smokes about a pack per day.  Ascending aortic aneurysm: His previous CT chest in 05/25 showed 4.1 cm ascending aortic aneurysm, overall stable from 2024.  He denies any chest pain, dyspnea currently.  He complains of nasal congestion, postnasal drip, sinus pressure related headache and pain behind eyes and clear eye discharge for the last 1 week.  Denies any fever or chills.  He has tried using Sudafed with mild relief.  He started taking Claritin yesterday, and felt better.  He also has cough with clear expectoration and mild dyspnea.   Past Medical History:  Diagnosis Date   Allergy    penicillin   Anemia    [employer]reesia 03/09/2020   Anxiety    Aortic atherosclerosis    Arthritis    Some on right wrist   Blood transfusion without reported diagnosis    Dr. Vonzell ordered blood transfusions years ago   Cancer [facility])    Skin cancer   Chronic mental illness    OCD   COPD (chronic obstructive pulmonary disease) (HCC)    That's what I'm told   Depression    Phreesia 03/09/2020   Helicobacter pylori gastritis 08/29/2013   TOOK PYLERA    History of MRSA infection    of the skin   Hypertension    Phreesia  03/09/2020   IDA (iron deficiency anemia)    Ileitis 11/06/2013   Neurofibroma of abdominal wall     Past Surgical History:  Procedure Laterality Date   ANKLE SURGERY     APPENDECTOMY N/A    [employer]reesia 03/09/2020   BIOPSY  07/25/2023   Procedure: BIOPSY;  Surgeon: Eartha Flavors, Toribio, MD;  Location: [facility];  Service: Gastroenterology;;   Capsule endoscopy  02/19/2007   DOQ:Opfpuzi view of gastric mucosa/Normal small bowel mucosa   COLONOSCOPY  02/02/2007   SLF:Two 4-mm to 6-mm rectal polyps/sigmoid colon diverticulosis and internal hemorrhoids. hyperplastic polyps.    COLONOSCOPY N/A 03/22/2013   SLF: ANEMIA MOST LIKELY DUE TO ULCERS IN terminal ileum/Moderate diverticulosis  in the sigmoid colon/ONE RECTAL polyp removed(hyperplastic). ileum bx with focal active ileitis. next TCS 03/2023 with overtube   COLONOSCOPY WITH PROPOFOL  N/A 07/25/2023   Procedure: COLONOSCOPY WITH PROPOFOL ;  Surgeon: Eartha Flavors Toribio, MD;  Location: [facility];  Service: Gastroenterology;  Laterality: N/A;  10:00AM;ASA 1   COLONOSCOPY, ESOPHAGOGASTRODUODENOSCOPY (EGD) AND ESOPHAGEAL DILATION N/A 03/22/2013   DOQ:IBDEYJHPJ DUE Stricture at the gastroesophageal junction/Large hiatal hernia/MILD Non-erosive gastritis & DUODENITIS (+h.pylori)   DENTAL SURGERY  02/2020   ESOPHAGOGASTRODUODENOSCOPY  02/02/2007   DOQ:Ojmhz hiatal hernia closely examined and no evidence of  Cameron ulcers.  Otherwise, normal esophagus. 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   POLYPECTOMY  07/25/2023   Procedure: POLYPECTOMY;  Surgeon: Eartha Flavors, Toribio, MD;  Location: [facility];  Service: Gastroenterology;;   SUBMUCOSAL LIFTING INJECTION  07/25/2023   Procedure: SUBMUCOSAL LIFTING INJECTION;  Surgeon: Eartha Flavors Toribio, MD;  Location: [facility];  Service: Gastroenterology;;   TONSILLECTOMY      Family History  Problem  Relation Age of Onset   Lung disease Mother    Obesity Father    Colon cancer Neg Hx     Social History   Socioeconomic History   Marital status: Married    Spouse name: Not on file   Number of children: 4   Years of education: Not on file   Highest education level: 12th grade  Occupational History   Occupation: Print production planner: Warden/ranger  Tobacco Use   Smoking status: Every Day    Current packs/day: 1.00    Average packs/day: 1 pack/day for 20.0 years (20.0 ttl pk-yrs)    Types: Cigarettes   Smokeless tobacco: Never   Tobacco comments:    10 cigarettes a day as of 02/06/24  Vaping Use   Vaping status: Never Used  Substance and Sexual Activity   Alcohol use: Yes    Alcohol/week: 1.0 standard drink of alcohol    Types: 1 Glasses of wine per week    Comment: Couple glasses of wine per month    Drug use: Yes    Types: Marijuana    Comment: Daily as needed   Sexual activity: Not Currently    Birth control/protection: None  Other Topics Concern   Not on file  Social History Narrative   Retired from [employer]    Social Drivers of Corporate investment banker Strain: Low Risk  (04/22/2024)   Overall Financial Resource Strain (CARDIA)    Difficulty of Paying Living Expenses: Not hard at all  Food Insecurity: No Food Insecurity (04/22/2024)   Hunger Vital Sign    Worried About Running Out of Food in the Last Year: Never true    Ran Out of Food in the Last Year: Never true  Transportation Needs: No Transportation Needs (04/22/2024)   PRAPARE - Administrator, Civil Service (Medical): No    Lack of Transportation (Non-Medical): No  Physical Activity: Inactive (04/22/2024)   Exercise Vital Sign    Days of Exercise per Week: 0 days    Minutes of Exercise per Session: 0 min  Stress: No Stress Concern Present (04/22/2024)   [employer] of Occupational Health - Occupational Stress Questionnaire    Feeling of Stress: Not at all  Social Connections:  Socially Integrated (04/22/2024)   Social Connection and Isolation Panel    Frequency of Communication with Friends and Family: More than three times a week    Frequency of Social Gatherings with Friends and Family: More than three times a week    Attends Religious Services: More than 4 times per year    Active Member of [employer] or Organizations: Yes    Attends Banker Meetings: More than 4 times per year    Marital Status: Married  Catering manager Violence: Not At Risk (04/22/2024)   Humiliation, Afraid, Rape, and Kick questionnaire    Fear of Current or Ex-Partner: No    Emotionally Abused: No  Physically Abused: No    Sexually Abused: No    Outpatient Medications Prior to Visit  Medication Sig Dispense Refill   acetaminophen (TYLENOL) 500 MG tablet Take 500 mg by mouth daily at 6 (six) AM.     ferrous sulfate 325 (65 FE) MG EC tablet Take 325 mg by mouth daily with breakfast.     lamoTRIgine (LAMICTAL) 100 MG tablet Take 100 mg by mouth daily.      Multiple Vitamin (MULTIVITAMIN) capsule Take 1 capsule by mouth daily.     telmisartan  (MICARDIS ) 20 MG tablet TAKE 1 TABLET DAILY 90 tablet 3   Vilazodone HCl (VIIBRYD) 40 MG TABS Take 40 mg by mouth daily.     No facility-administered medications prior to visit.    Allergies  Allergen Reactions   Penicillins Rash    Has patient had a PCN reaction causing immediate rash, facial/tongue/throat swelling, SOB or lightheadedness with hypotension: yes Has patient had a PCN reaction causing severe rash involving mucus membranes or skin necrosis: no Has patient had a PCN reaction that required hospitalization: no Has patient had a PCN reaction occurring within the last 10 years: no If all of the above answers are NO, then may proceed with Cephalosporin use.     ROS Review of Systems  Constitutional:  Negative for chills and fever.  HENT:  Positive for congestion, postnasal drip, sinus pressure and sore throat.   Eyes:   Positive for discharge. Negative for pain.  Respiratory:  Positive for cough and shortness of breath.   Cardiovascular:  Positive for palpitations. Negative for chest pain.  Gastrointestinal:  Negative for diarrhea, nausea and vomiting.  Endocrine: Negative for polydipsia and polyuria.  Genitourinary:  Negative for dysuria and hematuria.  Musculoskeletal:  Positive for arthralgias and back pain. Negative for neck pain and neck stiffness.  Skin:  Negative for rash.  Neurological:  Negative for dizziness, weakness, numbness and headaches.  Psychiatric/Behavioral:  Positive for sleep disturbance. Negative for agitation, behavioral problems, hallucinations and suicidal ideas. The patient is nervous/anxious.       Objective:    Physical Exam Vitals reviewed.  Constitutional:      General: He is not in acute distress.    Appearance: He is not diaphoretic.  HENT:     Head: Normocephalic and atraumatic.     Nose: Congestion present.     Right Sinus: Frontal sinus tenderness present.     Left Sinus: Frontal sinus tenderness present.     Mouth/Throat:     Mouth: Mucous membranes are moist.     Pharynx: Posterior oropharyngeal erythema present.  Eyes:     General: No scleral icterus.    Extraocular Movements: Extraocular movements intact.  Cardiovascular:     Rate and Rhythm: Normal rate and regular rhythm.     Pulses: Normal pulses.     Heart sounds: Normal heart sounds. No murmur heard. Pulmonary:     Breath sounds: Normal breath sounds. No wheezing or rales.  Musculoskeletal:        General: Swelling (MCP, PIP and DIP joints of left hand - Mild) present.     Cervical back: Neck supple. No tenderness.     Right lower leg: No edema.     Left lower leg: No edema.  Skin:    General: Skin is warm.     Findings: Bruising (B/l arms) present. No rash.     Comments: Erythematous papule over scalp  Neurological:     General: No focal deficit  present.     Mental Status: He is alert and  oriented to person, place, and time.     Sensory: No sensory deficit.     Motor: No weakness.  Psychiatric:        Mood and Affect: Mood normal.        Behavior: Behavior normal.     BP 127/86   Pulse 72   Ht 5' 8 (1.727 m)   Wt 144 lb 9.6 oz (65.6 kg)   SpO2 94%   BMI 21.99 kg/m  Wt Readings from Last 3 Encounters:  06/18/24 144 lb 9.6 oz (65.6 kg)  04/26/24 146 lb 3.2 oz (66.3 kg)  04/22/24 140 lb (63.5 kg)    Lab Results  Component Value Date   TSH 1.970 12/18/2023   Lab Results  Component Value Date   WBC 6.3 12/18/2023   HGB 12.7 (L) 12/18/2023   HCT 36.5 (L) 12/18/2023   MCV 99 (H) 12/18/2023   PLT 218 12/18/2023   Lab Results  Component Value Date   NA 143 12/18/2023   K 4.7 12/18/2023   CO2 25 12/18/2023   GLUCOSE 85 12/18/2023   BUN 18 12/18/2023   CREATININE 1.02 12/18/2023   BILITOT 0.4 12/18/2023   ALKPHOS 80 12/18/2023   AST 24 12/18/2023   ALT 17 12/18/2023   PROT 6.6 12/18/2023   ALBUMIN 4.6 12/18/2023   CALCIUM 9.3 12/18/2023   EGFR 76 12/18/2023   Lab Results  Component Value Date   CHOL 172 12/18/2023   Lab Results  Component Value Date   HDL 47 12/18/2023   Lab Results  Component Value Date   LDLCALC 111 (H) 12/18/2023   Lab Results  Component Value Date   TRIG 73 12/18/2023   Lab Results  Component Value Date   CHOLHDL 3.7 12/18/2023   Lab Results  Component Value Date   HGBA1C 5.4 12/18/2023      Assessment & Plan:   Problem List Items Addressed This Visit       Cardiovascular and Mediastinum   Essential hypertension - Primary   BP Readings from Last 1 Encounters:  06/18/24 127/86   Usually well-controlled with telmisartan  20 mg QD Needs to quit smoking Counseled for compliance with the medications Advised DASH diet and moderate exercise/walking, at least 150 mins/week      Relevant Orders   CBC with Differential/Platelet   CMP14+EGFR   Ascending aortic aneurysm   Noted on CT chest - 4.1 cm in  05/25 Plan to get CTA chest every 2 years        Respiratory   Emphysema of lung (HCC)   Has cough with clear expectoration and mild dyspnea, could be due to acute bronchitis in the setting of acute bacterial sinusitis Started empiric azithromycin  and Medrol Dosepak Mucinex as needed for cough If persistent or worsening symptoms, can consider albuterol as needed for dyspnea or wheezing Emphysema noted on CT chest      Relevant Medications   methylPREDNISolone (MEDROL DOSEPAK) 4 MG TBPK tablet   azithromycin  (ZITHROMAX ) 250 MG tablet   Acute non-recurrent frontal sinusitis   His current symptoms are likely due to acute bacterial sinusitis in addition to allergic sinusitis Started empiric azithromycin  as he has persistent symptoms despite symptomatic treatment Continue Claritin for allergies Advised to use vaporizer and/or sinus inhaler as needed for nasal congestion Mucinex as needed for cough      Relevant Medications   methylPREDNISolone (MEDROL DOSEPAK) 4 MG TBPK tablet  azithromycin  (ZITHROMAX ) 250 MG tablet     Other   Microcytic anemia   Has chronic bruising Advised to continue iron supplements and take Multivitamin daily Check CBC Denies any other signs of active bleeding      Relevant Orders   CBC with Differential/Platelet   Prediabetes   Lab Results  Component Value Date   HGBA1C 5.4 12/18/2023   Advised to follow DASH diet for now        Meds ordered this encounter  Medications   methylPREDNISolone (MEDROL DOSEPAK) 4 MG TBPK tablet    Sig: Take as package instructions.    Dispense:  1 each    Refill:  0   azithromycin  (ZITHROMAX ) 250 MG tablet    Sig: Take 2 tablets on day 1, then 1 tablet daily on days 2 through 5    Dispense:  6 tablet    Refill:  0    Follow-up: Return in about 6 months (around 12/17/2024) for HTN.    Suzzane MARLA Blanch, MD

## 2024-06-18 NOTE — Patient Instructions (Addendum)
 Please start taking Azithromycin  and Prednisone as prescribed.  Please continue to take medications as prescribed.  Please continue to follow low salt diet and perform moderate exercise/walking at least 150 mins/week.  Please get fasting blood tests done after 2 weeks.

## 2024-06-18 NOTE — Assessment & Plan Note (Signed)
 Noted on CT chest - 4.1 cm in 05/25 Plan to get CTA chest every 2 years

## 2024-06-18 NOTE — Assessment & Plan Note (Signed)
 BP Readings from Last 1 Encounters:  06/18/24 127/86   Usually well-controlled with telmisartan  20 mg QD Needs to quit smoking Counseled for compliance with the medications Advised DASH diet and moderate exercise/walking, at least 150 mins/week

## 2024-06-18 NOTE — Assessment & Plan Note (Signed)
 Lab Results  Component Value Date   HGBA1C 5.4 12/18/2023   Advised to follow DASH diet for now

## 2024-06-18 NOTE — Assessment & Plan Note (Signed)
 Has chronic bruising Advised to continue iron supplements and take Multivitamin daily Check CBC Denies any other signs of active bleeding

## 2024-06-18 NOTE — Assessment & Plan Note (Addendum)
 Has cough with clear expectoration and mild dyspnea, could be due to acute bronchitis in the setting of acute bacterial sinusitis Started empiric azithromycin  and Medrol Dosepak Mucinex as needed for cough If persistent or worsening symptoms, can consider albuterol as needed for dyspnea or wheezing Emphysema noted on CT chest

## 2024-06-26 ENCOUNTER — Encounter (INDEPENDENT_AMBULATORY_CARE_PROVIDER_SITE_OTHER): Payer: Self-pay | Admitting: Gastroenterology

## 2024-08-03 ENCOUNTER — Encounter: Payer: Self-pay | Admitting: Internal Medicine

## 2024-08-26 ENCOUNTER — Ambulatory Visit (INDEPENDENT_AMBULATORY_CARE_PROVIDER_SITE_OTHER)

## 2024-08-26 DIAGNOSIS — Z23 Encounter for immunization: Secondary | ICD-10-CM

## 2024-08-26 NOTE — Progress Notes (Signed)
 Patient is in office today for a nurse visit for Immunization. Patient Injection was given in the  Left deltoid. Patient tolerated injection well.

## 2024-12-18 ENCOUNTER — Ambulatory Visit: Admitting: Internal Medicine

## 2025-04-23 ENCOUNTER — Ambulatory Visit
